# Patient Record
Sex: Female | Born: 1990 | Race: White | Hispanic: No | Marital: Married | Smoking: Former smoker
Health system: Southern US, Community
[De-identification: ages and names within clinical notes are randomized; demographics above are authoritative.]

## PROBLEM LIST (undated history)

## (undated) DIAGNOSIS — R519 Headache, unspecified: Secondary | ICD-10-CM

## (undated) DIAGNOSIS — B3731 Acute candidiasis of vulva and vagina: Secondary | ICD-10-CM

## (undated) DIAGNOSIS — B373 Candidiasis of vulva and vagina: Secondary | ICD-10-CM

## (undated) DIAGNOSIS — R87619 Unspecified abnormal cytological findings in specimens from cervix uteri: Secondary | ICD-10-CM

## (undated) DIAGNOSIS — F419 Anxiety disorder, unspecified: Secondary | ICD-10-CM

## (undated) DIAGNOSIS — L309 Dermatitis, unspecified: Secondary | ICD-10-CM

## (undated) DIAGNOSIS — B019 Varicella without complication: Secondary | ICD-10-CM

## (undated) DIAGNOSIS — K219 Gastro-esophageal reflux disease without esophagitis: Secondary | ICD-10-CM

## (undated) DIAGNOSIS — O24419 Gestational diabetes mellitus in pregnancy, unspecified control: Secondary | ICD-10-CM

## (undated) DIAGNOSIS — R51 Headache: Secondary | ICD-10-CM

## (undated) DIAGNOSIS — Z789 Other specified health status: Secondary | ICD-10-CM

## (undated) DIAGNOSIS — E669 Obesity, unspecified: Secondary | ICD-10-CM

## (undated) DIAGNOSIS — IMO0002 Reserved for concepts with insufficient information to code with codable children: Secondary | ICD-10-CM

## (undated) HISTORY — DX: Reserved for concepts with insufficient information to code with codable children: IMO0002

## (undated) HISTORY — DX: Unspecified abnormal cytological findings in specimens from cervix uteri: R87.619

## (undated) HISTORY — PX: WISDOM TOOTH EXTRACTION: SHX21

---
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
## 2011-01-12 LAB — BASIC METABOLIC PANEL
Chloride: 106
Potassium: 4.1
Sodium: 142

## 2011-01-12 LAB — URINALYSIS, ROUTINE W REFLEX MICROSCOPIC
Ketones, ur: NEGATIVE
Nitrite: NEGATIVE
Specific Gravity, Urine: 1.014
pH: 7.5

## 2011-01-12 LAB — URINE MICROSCOPIC-ADD ON

## 2011-04-20 ENCOUNTER — Encounter (HOSPITAL_COMMUNITY): Payer: Self-pay

## 2011-04-20 ENCOUNTER — Inpatient Hospital Stay (HOSPITAL_COMMUNITY)
Admission: AD | Admit: 2011-04-20 | Discharge: 2011-04-21 | Disposition: A | Discharge status: Home / Self Care | Payer: Medicaid Other | Source: Ambulatory Visit | Attending: Obstetrics and Gynecology | Admitting: Obstetrics and Gynecology

## 2011-04-20 DIAGNOSIS — B9789 Other viral agents as the cause of diseases classified elsewhere: Secondary | ICD-10-CM | POA: Insufficient documentation

## 2011-04-20 DIAGNOSIS — O99891 Other specified diseases and conditions complicating pregnancy: Secondary | ICD-10-CM | POA: Insufficient documentation

## 2011-04-20 HISTORY — DX: Acute candidiasis of vulva and vagina: B37.31

## 2011-04-20 HISTORY — DX: Dermatitis, unspecified: L30.9

## 2011-04-20 HISTORY — DX: Obesity, unspecified: E66.9

## 2011-04-20 HISTORY — DX: Varicella without complication: B01.9

## 2011-04-20 HISTORY — DX: Candidiasis of vulva and vagina: B37.3

## 2011-04-20 NOTE — Progress Notes (Signed)
Patient is here with c/o cough, sore throat and upper chest hurting  For 2 days, she describes the pain as pulling pain when she takes a deep breath. She states that she was at [facility] hospital earlier today and was told that everything was fine. Her baby and her lungs sounded good. She states that she got a voicemail from [facility] telling her to read her book about what to take for codl during pregnancy but she couldn't find the book. So she decided to come in. She denies any abdominal pain, cramping, vaginal bleeding or lof. She reports good fetal movement.

## 2011-04-20 NOTE — Progress Notes (Signed)
Pt states, " I started feeling bad on Wed night; my throat started hurting and I started hurting in my chest and couldn't sleep. It hurts like a pulling feeling in my chest when I take a deep breath and my throat is still sore and I have a dry cough. I think I had a fever last night and started sweating."

## 2011-04-21 ENCOUNTER — Encounter: Payer: Self-pay | Admitting: Obstetrics and Gynecology

## 2011-04-21 NOTE — ED Provider Notes (Signed)
History     Chief Complaint  Patient presents with  . Sore Throat  . Cough  . Chest Pain   HPI Comments: Pt is a G1P0 at [redacted]w[redacted]d that arrived with CC of sore throat, chest congestion and runny nose, watery eyes x2 days. She has not tried any OTC tx, She states she was seen at [facility] today for same complaint and wasn't given anything, she states they checked her lungs and that baby and "everything was fine" She denies any ctx, VB, LOF, +FM.  Denies any pregnancy complications. Family members also have cold sx's, denies any fever.   Sore Throat  Associated symptoms include coughing. Pertinent negatives include no abdominal pain, shortness of breath or vomiting.  Cough Associated symptoms include chest pain and a sore throat. Pertinent negatives include no fever, myalgias, shortness of breath or wheezing.  Chest Pain  Associated symptoms include a cough. Pertinent negatives include no abdominal pain, fever, nausea, shortness of breath or vomiting.      Past Medical History  Diagnosis Date  . No pertinent past medical history   . Obese   . Varicella     at age 60 or 23  . Eczema   . Yeast infection of the vagina     frequent    Past Surgical History  Procedure Date  . Wisdom tooth extraction     History reviewed. No pertinent family history.  History  Substance Use Topics  . Smoking status: Former Games developer  . Smokeless tobacco: Never Used  . Alcohol Use: No    Allergies: No Known Allergies  No prescriptions prior to admission    Review of Systems  Constitutional: Negative for fever.  HENT: Positive for sore throat.   Respiratory: Positive for cough. Negative for shortness of breath and wheezing.   Cardiovascular: Positive for chest pain.       Describes as a shortness of air or difficulty taking a big breath.   Gastrointestinal: Negative for nausea, vomiting and abdominal pain.  Musculoskeletal: Negative for myalgias and joint pain.  All other systems  reviewed and are negative.   Physical Exam   Blood pressure 121/66, pulse 97, temperature 99.2 F (37.3 C), temperature source Oral, resp. rate 18, height 5' 2.5" (1.588 m), weight 90.493 kg (199 lb 8 oz), last menstrual period 10/14/2010, SpO2 95.00%.  Physical Exam  Nursing note and vitals reviewed. Constitutional: She is oriented to person, place, and time. She appears well-developed and well-nourished. No distress.  HENT:  Mouth/Throat: Mucous membranes are normal. Posterior oropharyngeal erythema present. No oropharyngeal exudate.  Cardiovascular: Normal rate and normal heart sounds.   Respiratory: Effort normal and breath sounds normal. No respiratory distress. She has no wheezes. She has no rales. She exhibits no tenderness.  GI: Soft.  Genitourinary:       deferred  Musculoskeletal: Normal range of motion. She exhibits no edema.  Neurological: She is alert and oriented to person, place, and time.  Skin: Skin is warm and dry.  Psychiatric: She has a normal mood and affect. Her behavior is normal. Judgment and thought content normal.   FHR 130 reactive 10x10, no decels CAT 1 toco quiet Pelvic deferred MAU Course  Procedures    Assessment and Plan  IUP at 26w Likely viral illness FHR reassuring  D/C home with OTC recommedations F/U as scheduled at office or if sx's worsen.   Kahdijah Errickson M 04/21/2011, 1:57 AM

## 2011-06-25 ENCOUNTER — Encounter (INDEPENDENT_AMBULATORY_CARE_PROVIDER_SITE_OTHER): Payer: Medicaid Other | Admitting: Obstetrics and Gynecology

## 2011-06-25 DIAGNOSIS — Z331 Pregnant state, incidental: Secondary | ICD-10-CM

## 2011-06-28 ENCOUNTER — Encounter (INDEPENDENT_AMBULATORY_CARE_PROVIDER_SITE_OTHER): Payer: Medicaid Other | Admitting: Registered Nurse

## 2011-06-28 DIAGNOSIS — Z331 Pregnant state, incidental: Secondary | ICD-10-CM

## 2011-07-01 ENCOUNTER — Inpatient Hospital Stay (HOSPITAL_COMMUNITY)
Admission: AD | Admit: 2011-07-01 | Discharge: 2011-07-01 | Disposition: A | Discharge status: Home Health | Payer: Medicaid Other | Source: Ambulatory Visit | Attending: Obstetrics and Gynecology | Admitting: Obstetrics and Gynecology

## 2011-07-01 ENCOUNTER — Encounter (HOSPITAL_COMMUNITY): Payer: Self-pay | Admitting: *Deleted

## 2011-07-01 DIAGNOSIS — O4700 False labor before 37 completed weeks of gestation, unspecified trimester: Secondary | ICD-10-CM | POA: Insufficient documentation

## 2011-07-01 DIAGNOSIS — O479 False labor, unspecified: Secondary | ICD-10-CM

## 2011-07-01 NOTE — Progress Notes (Signed)
Pelvic pressure since last Monday and getting stronger throughout the week.

## 2011-07-01 NOTE — Progress Notes (Signed)
21 yo g1p0 EDC 4/1 presents with c/o of contractions, denies srom, vag bleeding, with +FM O VSS     Fhts 135s LTV mod accels     uc irregular, few     abd soft, gravid, nt     Vag 2 by RN A 36 67 week IUP GBS positive P reviewed s/s labor to report: uc, srom, vag bleeding, kick counts, f/o office 1 week. Lavera Guise, CNM

## 2011-07-01 NOTE — Progress Notes (Signed)
Pt reports having increased pelvic pressure and back and abd cramping.  Pt reports having cramping since Tues (had appointment that day). Reports having a mucusy discharge as well.

## 2011-07-01 NOTE — Discharge Instructions (Signed)
Labor Induction  Most women go into labor on their own between 37 and 42 weeks of the pregnancy. When this does not happen or when there is a medical need, medicine or other methods may be used to induce labor. Labor induction causes a pregnant woman's uterus to contract. It also causes the cervix to soften (ripen), open (dilate), and thin out (efface). Usually, labor is not induced before 39 weeks of the pregnancy unless there is a problem with the baby or mother. Whether your labor will be induced depends on a number of factors, including the following:  The medical condition of you and the baby.   How many weeks along you are.   The status of baby's lung maturity.   The condition of the cervix.   The position of the baby.  REASONS FOR LABOR INDUCTION  The health of the baby or mother is at risk.   The pregnancy is overdue by 1 week or more.   The water breaks but labor does not start on its own.   The mother has a health condition or serious illness such as high blood pressure, infection, placental abruption, or diabetes.   The amniotic fluid amounts are low around the baby.   The baby is distressed.  REASONS TO NOT INDUCE LABOR Labor induction may not be a good idea if:  It is shown that your baby does not tolerate labor.   An induction is just more convenient.   You want the baby to be born on a certain date, like a holiday.   You have had previous surgeries on your uterus, such as a myomectomy or the removal of fibroids.   Your placenta lies very low in the uterus and blocks the opening of the cervix (placenta previa).   Your baby is not in a head down position.   The umbilical cord drops down into the birth canal in front of the baby. This could cut off the baby's blood and oxygen supply.   You have had a previous cesarean delivery.   There areunusual circumstances, such as the baby being extremely premature.  RISKS AND COMPLICATIONS Problems may occur in the  process of induction and plans may need to be modified as a situation unfolds. Some of the risks of induction include:  Change in fetal heart rate, such as too high, too low, or erratic.   Risk of fetal distress.   Risk of infection to mother and baby.   Increased chance of having a cesarean delivery.   The rare, but increased chance that the placenta will separate from the uterus (abruption).   Uterine rupture (very rare).  When induction is needed for medical reasons, the benefits of induction may outweigh the risks. BEFORE THE PROCEDURE Your caregiver will check your cervix and the baby's position. This will help your caregiver decide if you are far enough along for an induction to work. PROCEDURE Several methods of labor induction may be used, such as:   Taking prostaglandin medicine to dilate and ripen the cervix. The medicine will also start contractions. It can be taken by mouth or by inserting a suppository into the vagina.   A thin tube (catheter) with a balloon on the end may be inserted into your vagina to dilate the cervix. Once inserted, the balloon expands with water, which causes the cervix to open.   Striping the membranes. Your caregiver inserts a finger between the cervix and membranes, which causes the cervix to be stretched and   may cause the uterus to contract. This is often done during an office visit. You will be sent home to wait for the contractions to begin. You will then come in for an induction.   Breaking the water. Your caregiver will make a hole in the amniotic sac using a small instrument. Once the amniotic sac breaks, contractions should begin. This may still take hours to see an effect.   Taking medicine to trigger or strengthen contractions. This medicine is given intravenously through a tube in your arm.  All of the methods of induction, besides stripping the membranes, will be done in the hospital. Induction is done in the hospital so that you and the  baby can be carefully monitored. AFTER THE PROCEDURE Some inductions can take up to 2 or 3 days. Depending on the cervix, it usually takes less time. It takes longer when you are induced early in the pregnancy or if this is your first pregnancy. If a mother is still pregnant and the induction has been going on for 2 to 3 days, either the mother will be sent home or a cesarean delivery will be needed. Document Released: 08/29/2006 Document Revised: 03/29/2011 Document Reviewed: 02/12/2011 Exit[employer] Patient Information 2012 [employer]. 

## 2011-07-06 ENCOUNTER — Encounter (INDEPENDENT_AMBULATORY_CARE_PROVIDER_SITE_OTHER): Payer: Medicaid Other | Admitting: Registered Nurse

## 2011-07-06 DIAGNOSIS — Z3480 Encounter for supervision of other normal pregnancy, unspecified trimester: Secondary | ICD-10-CM

## 2011-07-11 ENCOUNTER — Encounter (HOSPITAL_COMMUNITY): Payer: Self-pay | Admitting: *Deleted

## 2011-07-11 ENCOUNTER — Inpatient Hospital Stay (HOSPITAL_COMMUNITY)
Admission: AD | Admit: 2011-07-11 | Discharge: 2011-07-15 | DRG: 766 | Disposition: A | Discharge status: Home / Self Care | Payer: Medicaid Other | Attending: Obstetrics and Gynecology | Admitting: Obstetrics and Gynecology

## 2011-07-11 ENCOUNTER — Encounter (HOSPITAL_COMMUNITY): Payer: Self-pay | Admitting: Anesthesiology

## 2011-07-11 DIAGNOSIS — IMO0001 Reserved for inherently not codable concepts without codable children: Secondary | ICD-10-CM

## 2011-07-11 DIAGNOSIS — IMO0002 Reserved for concepts with insufficient information to code with codable children: Secondary | ICD-10-CM

## 2011-07-11 DIAGNOSIS — E669 Obesity, unspecified: Secondary | ICD-10-CM

## 2011-07-11 DIAGNOSIS — Z22330 Carrier of Group B streptococcus: Secondary | ICD-10-CM

## 2011-07-11 DIAGNOSIS — O99892 Other specified diseases and conditions complicating childbirth: Secondary | ICD-10-CM | POA: Diagnosis present

## 2011-07-11 DIAGNOSIS — Z22338 Carrier of other streptococcus: Secondary | ICD-10-CM

## 2011-07-11 DIAGNOSIS — O4290 Premature rupture of membranes, unspecified as to length of time between rupture and onset of labor, unspecified weeks of gestation: Secondary | ICD-10-CM | POA: Diagnosis present

## 2011-07-11 LAB — CBC
HCT: 36.5 % (ref 36.0–46.0)
MCH: 27.7 pg (ref 26.0–34.0)
MCHC: 32.9 g/dL (ref 30.0–36.0)
MCV: 84.3 fL (ref 78.0–100.0)
RDW: 13.8 % (ref 11.5–15.5)

## 2011-07-11 MED ORDER — EPHEDRINE 5 MG/ML INJ
10.0000 mg | INTRAVENOUS | Status: DC | PRN
  Filled 2011-07-11: qty 4

## 2011-07-11 MED ORDER — PENICILLIN G POTASSIUM 5000000 UNITS IJ SOLR
5.0000 10*6.[IU] | Freq: Once | INTRAVENOUS | Status: AC
  Administered 2011-07-11: 5 10*6.[IU] via INTRAVENOUS
  Filled 2011-07-11: qty 5

## 2011-07-11 MED ORDER — PHENYLEPHRINE 40 MCG/ML (10ML) SYRINGE FOR IV PUSH (FOR BLOOD PRESSURE SUPPORT)
80.0000 ug | PREFILLED_SYRINGE | INTRAVENOUS | Status: DC | PRN
  Filled 2011-07-11: qty 5

## 2011-07-11 MED ORDER — LACTATED RINGERS IV SOLN
500.0000 mL | INTRAVENOUS | Status: DC | PRN
  Administered 2011-07-11: 500 mL via INTRAVENOUS
  Administered 2011-07-11: 300 mL via INTRAVENOUS

## 2011-07-11 MED ORDER — FENTANYL 2.5 MCG/ML BUPIVACAINE 1/10 % EPIDURAL INFUSION (WH - ANES)
14.0000 mL/h | INTRAMUSCULAR | Status: DC
  Administered 2011-07-11 (×3): 14 mL/h via EPIDURAL
  Filled 2011-07-11 (×4): qty 60

## 2011-07-11 MED ORDER — HYDROXYZINE HCL 50 MG/ML IM SOLN: 50.0000 mg | Freq: Four times a day (QID) | INTRAMUSCULAR | Status: DC | PRN

## 2011-07-11 MED ORDER — FENTANYL CITRATE 0.05 MG/ML IJ SOLN: 100.0000 ug | INTRAMUSCULAR | Status: DC | PRN

## 2011-07-11 MED ORDER — FENTANYL 2.5 MCG/ML BUPIVACAINE 1/10 % EPIDURAL INFUSION (WH - ANES)
INTRAMUSCULAR | Status: DC | PRN
  Administered 2011-07-11: 13 mL/h via EPIDURAL

## 2011-07-11 MED ORDER — EPHEDRINE 5 MG/ML INJ: 10.0000 mg | INTRAVENOUS | Status: DC | PRN

## 2011-07-11 MED ORDER — LIDOCAINE HCL (PF) 1 % IJ SOLN
30.0000 mL | INTRAMUSCULAR | Status: DC | PRN
  Filled 2011-07-11: qty 30

## 2011-07-11 MED ORDER — DIPHENHYDRAMINE HCL 50 MG/ML IJ SOLN: 12.5000 mg | INTRAMUSCULAR | Status: DC | PRN

## 2011-07-11 MED ORDER — OXYCODONE-ACETAMINOPHEN 5-325 MG PO TABS: 1.0000 | ORAL_TABLET | ORAL | Status: DC | PRN

## 2011-07-11 MED ORDER — OXYTOCIN 20 UNITS IN LACTATED RINGERS INFUSION - SIMPLE
1.0000 m[IU]/min | INTRAVENOUS | Status: DC
  Administered 2011-07-11: 10 m[IU]/min via INTRAVENOUS
  Administered 2011-07-11: 2 m[IU]/min via INTRAVENOUS

## 2011-07-11 MED ORDER — TERBUTALINE SULFATE 1 MG/ML IJ SOLN: 0.2500 mg | Freq: Once | INTRAMUSCULAR | Status: AC | PRN

## 2011-07-11 MED ORDER — ACETAMINOPHEN 325 MG PO TABS: 650.0000 mg | ORAL_TABLET | ORAL | Status: DC | PRN

## 2011-07-11 MED ORDER — ONDANSETRON HCL 4 MG/2ML IJ SOLN: 4.0000 mg | Freq: Four times a day (QID) | INTRAMUSCULAR | Status: DC | PRN

## 2011-07-11 MED ORDER — FLEET ENEMA 7-19 GM/118ML RE ENEM: 1.0000 | ENEMA | RECTAL | Status: DC | PRN

## 2011-07-11 MED ORDER — LACTATED RINGERS IV SOLN
INTRAVENOUS | Status: DC
  Administered 2011-07-11 – 2011-07-12 (×4): via INTRAVENOUS

## 2011-07-11 MED ORDER — OXYTOCIN 20 UNITS IN LACTATED RINGERS INFUSION - SIMPLE: 125.0000 mL/h | Freq: Once | INTRAVENOUS | Status: DC

## 2011-07-11 MED ORDER — PENICILLIN G POTASSIUM 5000000 UNITS IJ SOLR
2.5000 10*6.[IU] | INTRAVENOUS | Status: DC
  Administered 2011-07-11 (×5): 2.5 10*6.[IU] via INTRAVENOUS
  Filled 2011-07-11 (×6): qty 2.5

## 2011-07-11 MED ORDER — PHENYLEPHRINE 40 MCG/ML (10ML) SYRINGE FOR IV PUSH (FOR BLOOD PRESSURE SUPPORT): 80.0000 ug | PREFILLED_SYRINGE | INTRAVENOUS | Status: DC | PRN

## 2011-07-11 MED ORDER — HYDROXYZINE HCL 50 MG PO TABS: 50.0000 mg | ORAL_TABLET | Freq: Four times a day (QID) | ORAL | Status: DC | PRN

## 2011-07-11 MED ORDER — OXYTOCIN BOLUS FROM INFUSION
500.0000 mL | Freq: Once | INTRAVENOUS | Status: DC
  Filled 2011-07-11: qty 1000
  Filled 2011-07-11: qty 500

## 2011-07-11 MED ORDER — ACETAMINOPHEN 500 MG PO TABS
1000.0000 mg | ORAL_TABLET | Freq: Four times a day (QID) | ORAL | Status: DC | PRN
  Administered 2011-07-11: 1000 mg via ORAL
  Filled 2011-07-11: qty 2

## 2011-07-11 MED ORDER — LIDOCAINE HCL (PF) 1 % IJ SOLN
INTRAMUSCULAR | Status: DC | PRN
  Administered 2011-07-11 (×2): 4 mL

## 2011-07-11 MED ORDER — OXYTOCIN 20 UNITS IN LACTATED RINGERS INFUSION - SIMPLE: 1.0000 m[IU]/min | INTRAVENOUS | Status: DC

## 2011-07-11 MED ORDER — LACTATED RINGERS IV SOLN
500.0000 mL | Freq: Once | INTRAVENOUS | Status: AC
  Administered 2011-07-11: 500 mL via INTRAVENOUS

## 2011-07-11 MED ORDER — IBUPROFEN 600 MG PO TABS: 600.0000 mg | ORAL_TABLET | Freq: Four times a day (QID) | ORAL | Status: DC | PRN

## 2011-07-11 MED ORDER — CITRIC ACID-SODIUM CITRATE 334-500 MG/5ML PO SOLN
30.0000 mL | ORAL | Status: DC | PRN
  Administered 2011-07-12: 30 mL via ORAL
  Filled 2011-07-11 (×2): qty 15

## 2011-07-11 NOTE — Anesthesia Preprocedure Evaluation (Signed)
Anesthesia Evaluation  Patient identified by MRN, date of birth, ID band Patient awake    Reviewed: Allergy & Precautions, H&P , NPO status , Patient's Chart, lab work & pertinent test results  Airway Mallampati: II TM Distance: >3 FB Neck ROM: full    Dental No notable dental hx.    Pulmonary neg pulmonary ROS,    Pulmonary exam normal       Cardiovascular negative cardio ROS      Neuro/Psych negative neurological ROS  negative psych ROS   GI/Hepatic negative GI ROS, Neg liver ROS,   Endo/Other  Morbid obesity  Renal/GU negative Renal ROS  negative genitourinary   Musculoskeletal negative musculoskeletal ROS (+)   Abdominal (+) + obese,   Peds  Hematology negative hematology ROS (+)   Anesthesia Other Findings   Reproductive/Obstetrics (+) Pregnancy                           Anesthesia Physical Anesthesia Plan  ASA: III  Anesthesia Plan: Epidural   Post-op Pain Management:    Induction:   Airway Management Planned:   Additional Equipment:   Intra-op Plan:   Post-operative Plan:   Informed Consent: I have reviewed the patients History and Physical, chart, labs and discussed the procedure including the risks, benefits and alternatives for the proposed anesthesia with the patient or authorized representative who has indicated his/her understanding and acceptance.     Plan Discussed with:   Anesthesia Plan Comments:         Anesthesia Quick Evaluation  

## 2011-07-11 NOTE — Progress Notes (Signed)
Patient ID: Madison Perry, female   DOB: 10-14-1990, 21 y.o.   MRN: [id number] .Subjective: C/o increased pressure    Objective: BP 134/67  Pulse 107  Temp(Src) 98.1 F (36.7 C) (Oral)  Resp 20  Ht 5\' 2"  (1.575 m)  Wt 99.338 kg (219 lb)  BMI 40.06 kg/m2  SpO2 98%  LMP 10/14/2010   FHT:  FHR: 120 bpm, variability: moderate,  accelerations:  Present,  decelerations:  Absent UC:   regular, every 2-3 minutes SVE:   Dilation: 9 Effacement (%): 100 Station: 0 Exam by:: Madison Perry, CNM  Pitocin at 3mu  Assessment / Plan: Induction of labor due to PROM,  progressing well on pitocin GBS pos Vertex OT, will continue pitocin for titration Frequent position changes   Fetal Wellbeing:  Category I Pain Control:  Epidural  Dr Madison Perry updated  Madison Perry 07/11/2011, 7:29 PM

## 2011-07-11 NOTE — H&P (Signed)
Madison Perry is a 21 y.o. female presenting at 38.2 weeks unannounced with CC of SROM around 2330.  Reports leakage on several occasions since initial incident and reports "green" color.  Denies any VB, PIH, or UTI s/s.  GFM.  Denies any ctxs at present or pain.  Accompanied by her GM and s.o.  Cx 2cm at her last exam at office.  She is interested in an epidural eventually.  Pt's pregnancy has been overall uncomplicated.  She started [facility] at [facility] at the end of Sept 2012 and was around 13 weeks.  She has had normal pregnancy discomforts.  Anatomy u/s was WNL.  GBS positive in urine and was treated for UTI w/ PCN at that time and will be treated in labor.   Maternal Medical History:  Reason for admission: Reason for admission: rupture of membranes.  Contractions: Frequency: irregular.   Perceived severity is mild.   Pt not discerning ctxs on arrival to hospital  Fetal activity: Perceived fetal activity is normal.   Last perceived fetal movement was within the past hour.    Prenatal complications: 1.  Teen  2.  Obese 3.  Eczema 4.  GBS positive 5.  Perry/o frequent yeast     OB History    Grav Para Term Preterm Abortions TAB SAB Ect Mult Living   1              Past Medical History  Diagnosis Date  . No pertinent past medical history   . Obese   . Varicella     at age 25 or 34  . Eczema   . Yeast infection of the vagina     frequent   Past Surgical History  Procedure Date  . Wisdom tooth extraction   . Wisdom tooth extraction    Family History: family history includes Diabetes in her paternal grandmother; Heart disease in her paternal grandmother; and Hypertension in her father and paternal grandmother. Social History:  reports that she has quit smoking. She has never used smokeless tobacco. She reports that she does not drink alcohol or use illicit drugs.Pt has a 10th grade education.  Employer: [employer].  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    Review of Systems    Constitutional: Negative.   HENT: Negative.   Respiratory: Negative.   Cardiovascular: Negative.   Gastrointestinal: Negative.   Genitourinary: Negative.   Skin: Negative.   Neurological: Negative.     Dilation: 3 Effacement (%): 70;80 Exam by:: Perry. Jessika Rothery, CNM Blood pressure 133/79, pulse 94, temperature 97.8 F (36.6 C), temperature source Oral, resp. rate 20, height 5\' 2"  (1.575 m), weight 99.338 kg (219 lb), last menstrual period 10/14/2010. Maternal Exam:  Abdomen: Fetal presentation: vertex  Introitus: Ferning test: not done.  Nitrazine test: not done. Amniotic fluid character: meconium stained.  Pelvis: adequate for delivery.   Cervix: Cervix evaluated by digital exam.     Physical Exam  Constitutional: She is oriented to person, place, and time. She appears well-developed and well-nourished. No distress.  HENT:       braces  Cardiovascular: Normal rate and regular rhythm.   Respiratory: Effort normal and breath sounds normal.  GI: Soft. Bowel sounds are normal.       gravid  Genitourinary:       Cx:  3/70/-2; posterior; mod amt of green-colored amniotic noted on pad; palpated a forebag during SVE  Musculoskeletal: She exhibits edema.       Mild generalized  BLE edema; nonpitting  Neurological: She is alert and oriented to person, place, and time. She has normal reflexes.  Skin: Skin is warm and dry.  Psychiatric: She has a normal mood and affect. Her behavior is normal. Judgment and thought content normal.    EFM:  125, reactive, moderate variability, no decels TOCO:  UC's q2-6 min, mild on palpation  Prenatal labs: ABO, Rh: A/Positive/-- (08/22 0000) Antibody:  negative Rubella:  immune RPR:   nonreactive HBsAg:   negative HIV:   nonreactive GBS:   positive 1st trimester screen:  Negative AFP:  Negative 1hr gtt=126  Assessment/Plan: 1.  IUP at 38.2 2.  SROM at 2330 3.  Meconium fluid 4.  GBS positive 5.  Latent vs early labor  1.  Admit to BS  with Dr. Stefano Gaul as attending 2.  Routine L&D orders w/ PCN-G per protocol 3.  Epidural when ctxs get more uncomfortable prn 4.  Will CTO currently for labor progression w/ expectant management; Pitocin prn augmentation 5.  Will CTO consistency of meconium, and plan NICU at delivery prn 6.  C/w MD prn  Madison Perry 07/11/2011, 2:22 AM

## 2011-07-11 NOTE — Anesthesia Procedure Notes (Signed)
Epidural Patient location during procedure: OB Start time: 07/11/2011 12:20 PM  Staffing Anesthesiologist: Asiyah Pineau A. Performed by: anesthesiologist   Preanesthetic Checklist Completed: patient identified, site marked, surgical consent, pre-op evaluation, timeout performed, IV checked, risks and benefits discussed and monitors and equipment checked  Epidural Patient position: sitting Prep: site prepped and draped and DuraPrep Patient monitoring: continuous pulse ox and blood pressure Approach: midline Injection technique: LOR air  Needle:  Needle type: Tuohy  Needle gauge: 17 G Needle length: 9 cm Needle insertion depth: 6 cm Catheter type: closed end flexible Catheter size: 19 Gauge Catheter at skin depth: 11 cm Test dose: negative and Other  Assessment Events: blood not aspirated, injection not painful, no injection resistance, negative IV test and no paresthesia  Additional Notes Patient identified. Risks and benefits discussed including failed block, incomplete  Pain control, post dural puncture headache, nerve damage, paralysis, blood pressure Changes, nausea, vomiting, reactions to medications-both toxic and allergic and post Partum back pain. All questions were answered. Patient expressed understanding and wished to proceed. Sterile technique was used throughout procedure. Epidural site was Dressed with sterile barrier dressing. No paresthesias, signs of intravascular injection Or signs of intrathecal spread were encountered.  Patient was more comfortable after the epidural was dosed. Please see RN's note for documentation of vital signs and FHR which are stable.

## 2011-07-11 NOTE — Progress Notes (Signed)
Patient ID: Madison Perry, female   DOB: 12-04-1990, 21 y.o.   MRN: [id number] .Subjective: C/o "pressure" no urge to push   Objective: BP 134/67  Pulse 107  Temp(Src) 98.1 F (36.7 C) (Oral)  Resp 20  Ht 5\' 2"  (1.575 m)  Wt 99.338 kg (219 lb)  BMI 40.06 kg/m2  SpO2 98%  LMP 10/14/2010   FHT:  FHR: 120 bpm, variability: moderate,  accelerations:  Present,  decelerations:  Present occ mild early variable UC:   regular, every 3-5 minutes SVE:   Dilation: 9 Effacement (%): 100 Station: 0 Exam by:: Kegan Mckeithan, CNM  Pitocin at 1mu Just restarted MVU's about 140's since pitocin turned off  Assessment / Plan: Induction of labor due to PROM,  progressing well on pitocin GBS pos Late decels resolved, will titrate pitocin for adequate MVU's   Fetal Wellbeing:  Category I Pain Control:  Epidural  Update physician PRN  [name] 07/11/2011, 7:23 PM

## 2011-07-11 NOTE — Progress Notes (Addendum)
Patient ID: Madison Perry, female   DOB: 1990-08-02, 21 y.o.   MRN: [id number]. Marland KitchenSubjective:  Comfortable with epidural now  Objective: BP 113/58  Pulse 75  Temp(Src) 97.8 F (36.6 C) (Axillary)  Resp 22  Ht 5\' 2"  (1.575 m)  Wt 99.338 kg (219 lb)  BMI 40.06 kg/m2  SpO2 98%  LMP 10/14/2010   FHT:  FHR: 130 bpm, variability: moderate,  accelerations:  Present,  decelerations:  Absent UC:   regular, every 2-3 minutes SVE:   Dilation: 4 Effacement (%): 80 Station: -2 Exam by:: Nahomi Hegner,CNM  Pitocin at 18mu  Assessment / Plan: labor aug with pitocin secondary to PROM GBS pos on PCN Early/active labor IUPC placed without difficulty, vertex asynclitic Will continue to augment with pitocin for adequate ctx pattern Recheck cx in 2-3 hr , prn     Fetal Wellbeing:  Category I Pain Control:  Epidural  Update physician PRN  [name] 07/11/2011, 2:35 PM

## 2011-07-11 NOTE — Progress Notes (Signed)
Patient ID: Madison Perry, female   DOB: 10-Sep-1990, 21 y.o.   MRN: [id number] .Subjective: No c/o   Objective: BP 130/54  Pulse 85  Temp(Src) 98.2 F (36.8 C) (Oral)  Resp 22  Ht 5\' 2"  (1.575 m)  Wt 99.338 kg (219 lb)  BMI 40.06 kg/m2  LMP 10/14/2010   FHT:  FHR: 130 bpm, variability: moderate,  accelerations:  Present,  decelerations:  Absent UC:   Irregular,not tracing well SVE:   Dilation: 3 Effacement (%): 80 Exam by:: Akeema Broder, CNM    Assessment / Plan: PROM without labor GBS pos, rv'd PCN Will start pitocin augmentation   Fetal Wellbeing:  Category I Pain Control:  Labor support without medications  DR Haygood updated  Cheryln Balcom M 07/11/2011, 9:22 AM

## 2011-07-11 NOTE — Progress Notes (Addendum)
Patient ID: Madison Perry, female   DOB: [birth year], 21 y.o.   MRN: [id number] .Subjective:  sleeping  Objective: BP 116/60  Pulse 76  Temp(Src) 97.5 F (36.4 C) (Oral)  Resp 20  Ht 5\' 2"  (1.575 m)  Wt 99.338 kg (219 lb)  BMI 40.06 kg/m2  SpO2 98%  LMP 10/14/2010   FHT:  FHR: 130 bpm, variability: moderate,  accelerations:  Present,  decelerations:  Present late decels UC:   regular, every 2-3 minutes SVE:   Dilation: 7 Effacement (%): 100 Station: -1 Exam by:: Oluwatobi Visser MVU's 200  Pitocin at 22mu  Assessment / Plan: Induction of labor due to PROM,  progressing well on pitocin GBS pos Will Decrease pitocin to 10mu, and CTO FHR closely   Fetal Wellbeing:  Category II Pain Control:  Epidural  Update physician PRN  [name] 07/11/2011, 4:42 PM

## 2011-07-11 NOTE — MAU Note (Signed)
H. Steelman, CNM at bedside.  Assessment done and poc discussed with pt.  

## 2011-07-11 NOTE — Progress Notes (Signed)
Pt may go to room 160.

## 2011-07-11 NOTE — Progress Notes (Signed)
Patient ID: Madison Perry, female   DOB: 02-14-1991, 21 y.o.   MRN: [id number] .Subjective: Breathing w ctx, c/o increased pressure, c/o HA "feels hungry"   Objective: BP 127/65  Pulse 86  Temp(Src) 98.1 F (36.7 C) (Oral)  Resp 20  Ht 5\' 2"  (1.575 m)  Wt 99.338 kg (219 lb)  BMI 40.06 kg/m2  SpO2 98%  LMP 10/14/2010   FHT:  FHR: 120 bpm, variability: moderate,  accelerations:  Present,  decelerations:  Absent UC:   regular, every 2-3 minutes SVE:   Dilation: 9 Effacement (%): 100 Station: 0 Exam by:: Madison Perry, CNM  Pitocin at 4mu MVU's last 2 hours 140-170  Assessment / Plan: IOL secondary to PROM No cervical change since 1900 MVU's not adequate, will continue to titrate pitocin  GBS pos Will give tylenol for HA Will use epidural PCA, will consult anesthesia/replace epidural if level not adequate    Fetal Wellbeing:  Category I Pain Control:  Epidural  Update physician PRN  [name] 07/11/2011, 8:45 PM

## 2011-07-11 NOTE — Progress Notes (Signed)
Eustace Pen, CNM notified of pt presenting with ROM.  notified of meconium stained fluid seen.  Will be down to see pt.

## 2011-07-12 ENCOUNTER — Encounter (HOSPITAL_COMMUNITY)
Admission: AD | Disposition: A | Discharge status: Home / Self Care | Payer: Self-pay | Source: Home / Self Care | Attending: Obstetrics and Gynecology

## 2011-07-12 ENCOUNTER — Encounter (HOSPITAL_COMMUNITY): Payer: Self-pay | Admitting: Anesthesiology

## 2011-07-12 ENCOUNTER — Inpatient Hospital Stay (HOSPITAL_COMMUNITY): Payer: Medicaid Other | Admitting: Anesthesiology

## 2011-07-12 ENCOUNTER — Encounter (HOSPITAL_COMMUNITY): Payer: Self-pay

## 2011-07-12 LAB — CBC
Platelets: 241 10*3/uL (ref 150–400)
RDW: 13.6 % (ref 11.5–15.5)
WBC: 19.1 10*3/uL — ABNORMAL HIGH (ref 4.0–10.5)

## 2011-07-12 SURGERY — Surgical Case
Anesthesia: Epidural | Site: Abdomen | Wound class: Clean Contaminated

## 2011-07-12 MED ORDER — OXYTOCIN 20 UNITS IN LACTATED RINGERS INFUSION - SIMPLE: 125.0000 mL/h | INTRAVENOUS | Status: AC

## 2011-07-12 MED ORDER — IBUPROFEN 600 MG PO TABS
600.0000 mg | ORAL_TABLET | Freq: Four times a day (QID) | ORAL | Status: DC
  Administered 2011-07-12 – 2011-07-15 (×13): 600 mg via ORAL
  Filled 2011-07-12 (×13): qty 1

## 2011-07-12 MED ORDER — WITCH HAZEL-GLYCERIN EX PADS: 1.0000 "application " | MEDICATED_PAD | CUTANEOUS | Status: DC | PRN

## 2011-07-12 MED ORDER — LANOLIN HYDROUS EX OINT: 1.0000 "application " | TOPICAL_OINTMENT | CUTANEOUS | Status: DC | PRN

## 2011-07-12 MED ORDER — ONDANSETRON HCL 4 MG/2ML IJ SOLN
INTRAMUSCULAR | Status: AC
  Filled 2011-07-12: qty 2

## 2011-07-12 MED ORDER — SODIUM CHLORIDE 0.9 % IV SOLN
1.0000 ug/kg/h | INTRAVENOUS | Status: DC | PRN
  Filled 2011-07-12: qty 2.5

## 2011-07-12 MED ORDER — 0.9 % SODIUM CHLORIDE (POUR BTL) OPTIME
TOPICAL | Status: DC | PRN
  Administered 2011-07-12: 1000 mL

## 2011-07-12 MED ORDER — KETOROLAC TROMETHAMINE 60 MG/2ML IM SOLN
60.0000 mg | Freq: Once | INTRAMUSCULAR | Status: AC | PRN
  Administered 2011-07-12: 60 mg via INTRAMUSCULAR

## 2011-07-12 MED ORDER — DIBUCAINE 1 % RE OINT: 1.0000 "application " | TOPICAL_OINTMENT | RECTAL | Status: DC | PRN

## 2011-07-12 MED ORDER — ONDANSETRON HCL 4 MG/2ML IJ SOLN
INTRAMUSCULAR | Status: DC | PRN
  Administered 2011-07-12: 4 mg via INTRAVENOUS

## 2011-07-12 MED ORDER — LIDOCAINE-EPINEPHRINE (PF) 2 %-1:200000 IJ SOLN
INTRAMUSCULAR | Status: DC | PRN
  Administered 2011-07-12: 5 mL via EPIDURAL

## 2011-07-12 MED ORDER — FENTANYL CITRATE 0.05 MG/ML IJ SOLN
INTRAMUSCULAR | Status: AC
  Filled 2011-07-12: qty 2

## 2011-07-12 MED ORDER — OXYTOCIN 10 UNIT/ML IJ SOLN
INTRAMUSCULAR | Status: AC
  Filled 2011-07-12: qty 4

## 2011-07-12 MED ORDER — OXYTOCIN 20 UNITS IN LACTATED RINGERS INFUSION - SIMPLE
INTRAVENOUS | Status: DC | PRN
  Administered 2011-07-12: 20 [IU] via INTRAVENOUS

## 2011-07-12 MED ORDER — ONDANSETRON HCL 4 MG PO TABS: 4.0000 mg | ORAL_TABLET | ORAL | Status: DC | PRN

## 2011-07-12 MED ORDER — SENNOSIDES-DOCUSATE SODIUM 8.6-50 MG PO TABS
2.0000 | ORAL_TABLET | Freq: Every day | ORAL | Status: DC
  Administered 2011-07-12 – 2011-07-14 (×3): 2 via ORAL

## 2011-07-12 MED ORDER — IBUPROFEN 600 MG PO TABS: 600.0000 mg | ORAL_TABLET | Freq: Four times a day (QID) | ORAL | Status: DC | PRN

## 2011-07-12 MED ORDER — SODIUM BICARBONATE 8.4 % IV SOLN
INTRAVENOUS | Status: AC
  Filled 2011-07-12: qty 50

## 2011-07-12 MED ORDER — SCOPOLAMINE 1 MG/3DAYS TD PT72
MEDICATED_PATCH | TRANSDERMAL | Status: AC
  Filled 2011-07-12: qty 1

## 2011-07-12 MED ORDER — CEFAZOLIN SODIUM 1-5 GM-% IV SOLN
INTRAVENOUS | Status: DC | PRN
  Administered 2011-07-12: 2 g via INTRAVENOUS

## 2011-07-12 MED ORDER — ONDANSETRON HCL 4 MG/2ML IJ SOLN: 4.0000 mg | Freq: Three times a day (TID) | INTRAMUSCULAR | Status: DC | PRN

## 2011-07-12 MED ORDER — SODIUM CHLORIDE 0.9 % IJ SOLN: 3.0000 mL | INTRAMUSCULAR | Status: DC | PRN

## 2011-07-12 MED ORDER — KETOROLAC TROMETHAMINE 30 MG/ML IJ SOLN: 30.0000 mg | Freq: Four times a day (QID) | INTRAMUSCULAR | Status: AC | PRN

## 2011-07-12 MED ORDER — SCOPOLAMINE 1 MG/3DAYS TD PT72
1.0000 | MEDICATED_PATCH | Freq: Once | TRANSDERMAL | Status: AC
  Administered 2011-07-12: 1.5 mg via TRANSDERMAL

## 2011-07-12 MED ORDER — OXYCODONE-ACETAMINOPHEN 5-325 MG PO TABS
1.0000 | ORAL_TABLET | ORAL | Status: DC | PRN
  Administered 2011-07-13 – 2011-07-14 (×3): 1 via ORAL
  Filled 2011-07-12 (×3): qty 1

## 2011-07-12 MED ORDER — MENTHOL 3 MG MT LOZG: 1.0000 | LOZENGE | OROMUCOSAL | Status: DC | PRN

## 2011-07-12 MED ORDER — SIMETHICONE 80 MG PO CHEW
80.0000 mg | CHEWABLE_TABLET | Freq: Three times a day (TID) | ORAL | Status: DC
  Administered 2011-07-12 – 2011-07-15 (×12): 80 mg via ORAL

## 2011-07-12 MED ORDER — LACTATED RINGERS IV SOLN
INTRAVENOUS | Status: DC | PRN
  Administered 2011-07-12: 01:00:00 via INTRAVENOUS

## 2011-07-12 MED ORDER — NALBUPHINE SYRINGE 5 MG/0.5 ML
5.0000 mg | INJECTION | INTRAMUSCULAR | Status: DC | PRN
  Filled 2011-07-12: qty 1

## 2011-07-12 MED ORDER — TETANUS-DIPHTH-ACELL PERTUSSIS 5-2.5-18.5 LF-MCG/0.5 IM SUSP: 0.5000 mL | Freq: Once | INTRAMUSCULAR | Status: DC

## 2011-07-12 MED ORDER — DIPHENHYDRAMINE HCL 50 MG/ML IJ SOLN: 12.5000 mg | INTRAMUSCULAR | Status: DC | PRN

## 2011-07-12 MED ORDER — MORPHINE SULFATE 0.5 MG/ML IJ SOLN
INTRAMUSCULAR | Status: AC
  Filled 2011-07-12: qty 10

## 2011-07-12 MED ORDER — CEFAZOLIN SODIUM 1-5 GM-% IV SOLN
INTRAVENOUS | Status: AC
  Filled 2011-07-12: qty 100

## 2011-07-12 MED ORDER — DIPHENHYDRAMINE HCL 25 MG PO CAPS: 25.0000 mg | ORAL_CAPSULE | Freq: Four times a day (QID) | ORAL | Status: DC | PRN

## 2011-07-12 MED ORDER — FENTANYL CITRATE 0.05 MG/ML IJ SOLN
INTRAMUSCULAR | Status: DC | PRN
  Administered 2011-07-12: 100 ug via INTRAVENOUS
  Administered 2011-07-12 (×2): 50 ug via INTRAVENOUS

## 2011-07-12 MED ORDER — MEDROXYPROGESTERONE ACETATE 150 MG/ML IM SUSP: 150.0000 mg | INTRAMUSCULAR | Status: DC | PRN

## 2011-07-12 MED ORDER — ONDANSETRON HCL 4 MG/2ML IJ SOLN: 4.0000 mg | INTRAMUSCULAR | Status: DC | PRN

## 2011-07-12 MED ORDER — BUPIVACAINE HCL (PF) 0.25 % IJ SOLN
INTRAMUSCULAR | Status: AC
  Filled 2011-07-12: qty 30

## 2011-07-12 MED ORDER — DIPHENHYDRAMINE HCL 25 MG PO CAPS: 25.0000 mg | ORAL_CAPSULE | ORAL | Status: DC | PRN

## 2011-07-12 MED ORDER — LACTATED RINGERS IV SOLN
INTRAVENOUS | Status: DC
  Administered 2011-07-12: 06:00:00 via INTRAVENOUS

## 2011-07-12 MED ORDER — METOCLOPRAMIDE HCL 5 MG/ML IJ SOLN: 10.0000 mg | Freq: Three times a day (TID) | INTRAMUSCULAR | Status: DC | PRN

## 2011-07-12 MED ORDER — MORPHINE SULFATE (PF) 0.5 MG/ML IJ SOLN
INTRAMUSCULAR | Status: DC | PRN
  Administered 2011-07-12: 4 mg via EPIDURAL

## 2011-07-12 MED ORDER — DIPHENHYDRAMINE HCL 50 MG/ML IJ SOLN: 25.0000 mg | INTRAMUSCULAR | Status: DC | PRN

## 2011-07-12 MED ORDER — LIDOCAINE-EPINEPHRINE (PF) 2 %-1:200000 IJ SOLN
INTRAMUSCULAR | Status: AC
  Filled 2011-07-12: qty 20

## 2011-07-12 MED ORDER — FENTANYL CITRATE 0.05 MG/ML IJ SOLN: 25.0000 ug | INTRAMUSCULAR | Status: DC | PRN

## 2011-07-12 MED ORDER — MIDAZOLAM HCL 5 MG/5ML IJ SOLN
INTRAMUSCULAR | Status: DC | PRN
  Administered 2011-07-12: 1 mg via INTRAVENOUS

## 2011-07-12 MED ORDER — MORPHINE SULFATE (PF) 0.5 MG/ML IJ SOLN
INTRAMUSCULAR | Status: DC | PRN
  Administered 2011-07-12: 1 mg via INTRAVENOUS

## 2011-07-12 MED ORDER — ZOLPIDEM TARTRATE 5 MG PO TABS: 5.0000 mg | ORAL_TABLET | Freq: Every evening | ORAL | Status: DC | PRN

## 2011-07-12 MED ORDER — NALOXONE HCL 0.4 MG/ML IJ SOLN: 0.4000 mg | INTRAMUSCULAR | Status: DC | PRN

## 2011-07-12 MED ORDER — MIDAZOLAM HCL 2 MG/2ML IJ SOLN
INTRAMUSCULAR | Status: AC
  Filled 2011-07-12: qty 2

## 2011-07-12 MED ORDER — MEPERIDINE HCL 25 MG/ML IJ SOLN: 6.2500 mg | INTRAMUSCULAR | Status: DC | PRN

## 2011-07-12 MED ORDER — BUPIVACAINE HCL (PF) 0.25 % IJ SOLN
INTRAMUSCULAR | Status: DC | PRN
  Administered 2011-07-12: 20 mL

## 2011-07-12 MED ORDER — SIMETHICONE 80 MG PO CHEW: 80.0000 mg | CHEWABLE_TABLET | ORAL | Status: DC | PRN

## 2011-07-12 MED ORDER — PRENATAL MULTIVITAMIN CH
1.0000 | ORAL_TABLET | Freq: Every day | ORAL | Status: DC
  Administered 2011-07-12 – 2011-07-15 (×4): 1 via ORAL
  Filled 2011-07-12 (×4): qty 1

## 2011-07-12 MED ORDER — KETOROLAC TROMETHAMINE 60 MG/2ML IM SOLN
INTRAMUSCULAR | Status: AC
  Filled 2011-07-12: qty 2

## 2011-07-12 SURGICAL SUPPLY — 51 items
ADH SKN CLS APL DERMABOND .7 (GAUZE/BANDAGES/DRESSINGS)
APL SKNCLS STERI-STRIP NONHPOA (GAUZE/BANDAGES/DRESSINGS)
BENZOIN TINCTURE PRP APPL 2/3 (GAUZE/BANDAGES/DRESSINGS) IMPLANT
BLADE EXTENDED COATED 6.5IN (ELECTRODE) IMPLANT
BLADE HEX COATED 2.75 (ELECTRODE) IMPLANT
BOOTIES KNEE HIGH SLOAN (MISCELLANEOUS) ×4 IMPLANT
CHLORAPREP W/TINT 26ML (MISCELLANEOUS) ×2 IMPLANT
CLOTH BEACON ORANGE TIMEOUT ST (SAFETY) ×2 IMPLANT
CONTAINER PREFILL 10% NBF 15ML (MISCELLANEOUS) ×2 IMPLANT
DERMABOND ADVANCED (GAUZE/BANDAGES/DRESSINGS)
DERMABOND ADVANCED .7 DNX12 (GAUZE/BANDAGES/DRESSINGS) IMPLANT
DRAIN JACKSON PRT FLT 7MM (DRAIN) IMPLANT
DRESSING TELFA 8X3 (GAUZE/BANDAGES/DRESSINGS) IMPLANT
ELECT REM PT RETURN 9FT ADLT (ELECTROSURGICAL) ×2
ELECTRODE REM PT RTRN 9FT ADLT (ELECTROSURGICAL) ×1 IMPLANT
EVACUATOR SILICONE 100CC (DRAIN) IMPLANT
EXTRACTOR VACUUM KIWI (MISCELLANEOUS) IMPLANT
EXTRACTOR VACUUM M CUP 4 TUBE (SUCTIONS) IMPLANT
GAUZE SPONGE 4X4 12PLY STRL LF (GAUZE/BANDAGES/DRESSINGS) ×3 IMPLANT
GLOVE BIO SURGEON STRL SZ7 (GLOVE) ×1 IMPLANT
GLOVE SKINSENSE NS SZ7.5 (GLOVE) ×2
GLOVE SKINSENSE NS SZ8.0 LF (GLOVE) ×2
GLOVE SKINSENSE STRL SZ7.5 (GLOVE) IMPLANT
GLOVE SKINSENSE STRL SZ8.0 LF (GLOVE) IMPLANT
GLOVE SURG SS PI 6.5 STRL IVOR (GLOVE) ×4 IMPLANT
GOWN PREVENTION PLUS LG XLONG (DISPOSABLE) ×6 IMPLANT
KIT ABG SYR 3ML LUER SLIP (SYRINGE) IMPLANT
NDL HYPO 25X5/8 SAFETYGLIDE (NEEDLE) ×1 IMPLANT
NDL SPNL 22GX3.5 QUINCKE BK (NEEDLE) ×1 IMPLANT
NEEDLE HYPO 25X5/8 SAFETYGLIDE (NEEDLE) ×2 IMPLANT
NEEDLE SPNL 22GX3.5 QUINCKE BK (NEEDLE) ×2 IMPLANT
NS IRRIG 1000ML POUR BTL (IV SOLUTION) ×2 IMPLANT
PACK C SECTION WH (CUSTOM PROCEDURE TRAY) ×2 IMPLANT
PAD ABD 7.5X8 STRL (GAUZE/BANDAGES/DRESSINGS) ×1 IMPLANT
SLEEVE SCD COMPRESS KNEE MED (MISCELLANEOUS) ×1 IMPLANT
STRIP CLOSURE SKIN 1/4X4 (GAUZE/BANDAGES/DRESSINGS) IMPLANT
SUT CHROMIC 2 0 SH (SUTURE) ×2 IMPLANT
SUT MNCRL AB 3-0 PS2 27 (SUTURE) ×2 IMPLANT
SUT SILK 0 FSL (SUTURE) IMPLANT
SUT VIC AB 0 CT1 27 (SUTURE) ×4
SUT VIC AB 0 CT1 27XBRD ANBCTR (SUTURE) ×2 IMPLANT
SUT VIC AB 0 CT1 36 (SUTURE) ×2 IMPLANT
SUT VIC AB 0 CTXB 36 (SUTURE) IMPLANT
SUT VIC AB 2-0 CT1 27 (SUTURE) ×4
SUT VIC AB 2-0 CT1 TAPERPNT 27 (SUTURE) ×2 IMPLANT
SUT VIC AB 2-0 SH 27 (SUTURE)
SUT VIC AB 2-0 SH 27XBRD (SUTURE) IMPLANT
SYR CONTROL 10ML LL (SYRINGE) ×2 IMPLANT
TOWEL OR 17X24 6PK STRL BLUE (TOWEL DISPOSABLE) ×4 IMPLANT
TRAY FOLEY CATH 14FR (SET/KITS/TRAYS/PACK) ×1 IMPLANT
WATER STERILE IRR 1000ML POUR (IV SOLUTION) ×1 IMPLANT

## 2011-07-12 NOTE — Addendum Note (Signed)
Addendum  created 07/12/11 0827 by Shanon Payor, CRNA   Modules edited:Notes Section

## 2011-07-12 NOTE — Progress Notes (Signed)
Patient ID: Madison Perry, female   DOB: 27-Dec-1990, 21 y.o.   MRN: [id number] .Subjective: Remains comfortable with epidural, HA resolved   Objective: BP 118/74  Pulse 91  Temp(Src) 98.1 F (36.7 C) (Oral)  Resp 20  Ht 5\' 2"  (1.575 m)  Wt 99.338 kg (219 lb)  BMI 40.06 kg/m2  SpO2 98%  LMP 10/14/2010   FHT:  FHR: 130 bpm, variability: moderate,  accelerations:  Present,  decelerations:  Absent UC:   regular, every 2-3 minutes SVE:   Dilation: 9 Effacement (%): 100 Station: 0 Exam by:: Janson Lamar, CNM  Pitocin at 7mu  Assessment / Plan: no cervical change, after >2hour MUV >180 PROM GBS pos - received PCN FHR reassuring   Fetal Wellbeing:  Category I Pain Control:  Epidural  R/B reviewed w pt, risks including but not limited to: 1. Infection 2. Bleeding 3. Damage to internal organs 4. Anesthesia complications  D/W Dr Pennie Rushing Will proceed with primary C/S   Dagny Fiorentino M 07/12/2011, 12:04 AM

## 2011-07-12 NOTE — Op Note (Signed)
Cesarean Section Procedure Note  Indications: failure to progress: arrest of dilation  Pre-operative Diagnosis: 38 week 3 day pregnancy.  Post-operative Diagnosis: same  Surgeon: Hal Morales  First Assistant:Shelley Lillard  Surgeon: Hal Morales   Assistants:   Anesthesia: Epidural anesthesia  ASA Class: 2  Procedure Details  The patient was seen in the Holding Room. The risks, benefits, complications, treatment options, and expected outcomes were discussed with the patient.  The patient concurred with the proposed plan, giving informed consent.  The site of surgery properly noted/marked. The patient was taken to Operating Room # 1, identified as Madison Perry and the procedure verified as C-Section Delivery. A Time Out was held and the above information confirmed.  After induction of anesthesia, the patient was  prepped with Chloraprep in the usual sterile manner.A foley catheter was placed under sterile conditions.  The patient was then draped in the usual fashion.   Suprapubicsubcutaneous injection of 0.25% Bupivacaine   A Pfannenstiel incision was made and carried down through the subcutaneous tissue to the fascia. Fascial incision was made and extended transversely. The fascia was separated from the underlying rectus tissue superiorly and inferiorly. The peritoneum was identified and entered. Peritoneal incision was extended longitudinally.  The bladder blade was placed.   A low transverse uterine incision was made two cm above the uterovesical fold, and that incision extended transversely bluntly.The infant was  delivered from OT presentation was a 8 pound 3 ounce female with Apgar scores of 9 at one minute and 9 at five minutes. After the umbilical cord was clamped and cut cord blood was obtained for evaluation. The placenta was removed intact and appeared normal. The uterine outline, tubes and ovaries appeared norma for the gravid state. The uterine incision was closed  with running locked sutures of 0 Vicryl. An imbricating layer of sutures was placed. Hemostasis was observed. Lavage was carried out until clear. The peritoneum was closed with a running suture of 2-0 Vicryl.  The rectus muscles were reapproximated with a figure of 8 suture of 2-0 Vicryl.  The fascia was then reapproximated with a running sutures of 0 Vicryl .Renforcing figure of 8 sutures of 0Vicryl were placed on either side of midline.   The skin was reapproximated with 3-0 moncryl.  A sterile dressing was applied.  Instrument, sponge, and needle counts were correct prior to the abdominal closure and at the conclusion of the case.   Findings:  Placenta contained a 3vessel cord  Estimated Blood Loss:  750         Drains: none         Total IV Fluids:         Specimens: placenta         Implants: none         Complications: ::"None; patient tolerated the procedure well."         Disposition: PACU - hemodynamically stable. infant to the full-term nursery         Condition: stable  Attending Attestation: I performed the procedure.  HAYGOOD,VANESSA P  07/12/2011 1:39 AM

## 2011-07-12 NOTE — Progress Notes (Signed)
Patient Oxygen level decrease low as 89% when patient is sleeping. Rise back up to 95% when waking patient up to do some deep breathing. Notified MD on call and reported to AM Nurse.

## 2011-07-12 NOTE — Anesthesia Postprocedure Evaluation (Signed)
  Anesthesia Post-op Note  Patient: Madison Perry  Procedure(s) Performed: Procedure(s) (LRB): CESAREAN SECTION (N/A)  Patient Location: Mother/Baby  Anesthesia Type: Epidural  Level of Consciousness: awake, alert  and oriented  Airway and Oxygen Therapy: Patient Spontanous Breathing  Post-op Pain: none  Post-op Assessment: Post-op Vital signs reviewed, Patient's Cardiovascular Status Stable, No headache, No backache, No residual numbness and No residual motor weakness  Post-op Vital Signs: Reviewed and stable  Complications: No apparent anesthesia complications

## 2011-07-12 NOTE — Progress Notes (Signed)
UR Chart review completed.  

## 2011-07-12 NOTE — Progress Notes (Signed)
Madison Perry is a 21 y.o. G1P0000 at [redacted]w[redacted]d by LMP admitted for rupture of membranes  Subjective: comfortable with epidural   Objective: BP 136/58  Pulse 119  Temp(Src) 98.2 F (36.8 C) (Oral)  Resp 20  Ht 5\' 2"  (1.575 m)  Wt 219 lb (99.338 kg)  BMI 40.06 kg/m2  SpO2 98%  LMP 10/14/2010   Total I/O In: -  Out: 650 [Urine:650]  FHT:  FHR: 130s bpm, variability: moderate,  accelerations:  Present,  decelerations:  Absent UC:   regular, every 2-3 minutes SVE:   Dilation: 9 Effacement (%): 100 Station: 0 Exam by:: Lillard, CNM  Labs: Lab Results  Component Value Date   WBC 12.8* 07/11/2011   HGB 12.0 07/11/2011   HCT 36.5 07/11/2011   MCV 84.3 07/11/2011   PLT 283 07/11/2011    Assessment / Plan: Arrest in active phase of labor  Labor: Failure to progress with adequate uterine contractions Preeclampsia:  no signs or symptoms of toxicity Fetal Wellbeing:  Category II Pain Control:  Epidural I/D:  positive GBS with last penicillin dose at 11pm Anticipated MOD:  CSection recommended and accepted. Indications, risks and benefits reviewed and patient consents.  Madison Perry 07/12/2011, 12:23 AM

## 2011-07-12 NOTE — Progress Notes (Signed)
S Resting quietly in bed, c/o hungry, comfortable, O2 makes my nose dry O Easy respirations, lungs clear bilaterally on auscultation, AP RRR, -Homan's sign         bilaterally,     O2 Sat to 87 at times with sleep A low O2 sats with sleep P discussed importance of maintaining normal O2 sats, decrease with sleep, narcotics, agrees to humidified O2 nasal cannula. Collaboration with Dr. Normand Sloop per telephone. Lavera Guise, CNM

## 2011-07-12 NOTE — Progress Notes (Addendum)
Haygood, MD, at bedside. Patient informed of the risks and benefits of a cesearean section. Patient verbalizes understanding. Consents obtained

## 2011-07-12 NOTE — Anesthesia Postprocedure Evaluation (Signed)
Anesthesia Post Note  Patient: Madison Perry  Procedure(s) Performed: Procedure(s) (LRB): CESAREAN SECTION (N/A)  Anesthesia type: Epidural  Patient location: PACU  Post pain: Pain level controlled  Post assessment: Post-op Vital signs reviewed  Last Vitals:  Filed Vitals:   07/12/11 0215  BP: 135/80  Pulse: 103  Temp:   Resp: 22    Post vital signs: stable  Level of consciousness: awake  Complications: No apparent anesthesia complications

## 2011-07-12 NOTE — Transfer of Care (Signed)
Immediate Anesthesia Transfer of Care Note  Patient: Madison Perry  Procedure(s) Performed: Procedure(s) (LRB): CESAREAN SECTION (N/A)  Patient Location: PACU  Anesthesia Type: Epidural  Level of Consciousness: awake, alert , oriented and patient cooperative  Airway & Oxygen Therapy: Patient Spontanous Breathing  Post-op Assessment: Report given to PACU RN and Post -op Vital signs reviewed and stable  Post vital signs: Reviewed and stable  Complications: No apparent anesthesia complications

## 2011-07-13 NOTE — Progress Notes (Signed)
Subjective: Postpartum Day 1: Cesarean Delivery Patient reports no problems voiding.  Doing well, up ad lib without any syncope or dizziness.  Undecided regarding contraception.    Objective: Vital signs in last 24 hours: Temp:  [97.5 F (36.4 C)-98.3 F (36.8 C)] 97.5 F (36.4 C) (03/22 1430) Pulse Rate:  [67-86] 67  (03/22 1430) Resp:  [16-18] 18  (03/22 1430) BP: (99-112)/(60-78) 100/66 mmHg (03/22 1430) SpO2:  [96 %-97 %] 97 % (03/22 0020)  Results for orders placed during the hospital encounter of 07/11/11 (from the past 48 hour(s))  CBC     Status: Abnormal   Collection Time   07/12/11  5:00 AM      Component Value Range Comment   WBC 19.1 (*) 4.0 - 10.5 (K/uL)    RBC 3.86 (*) 3.87 - 5.11 (MIL/uL)    Hemoglobin 10.7 (*) 12.0 - 15.0 (g/dL)    HCT 16.1 (*) 09.6 - 46.0 (%)    MCV 84.5  78.0 - 100.0 (fL)    MCH 27.7  26.0 - 34.0 (pg)    MCHC 32.8  30.0 - 36.0 (g/dL)    RDW 04.5  40.9 - 81.1 (%)    Platelets 241  150 - 400 (K/uL)    Labs done yesterday am.  Physical Exam:  General: alert Lochia: appropriate Uterine Fundus: firm Incision: healing well DVT Evaluation: No evidence of DVT seen on physical exam. Negative Homan's sign.   Basename 07/12/11 0500 07/11/11 0225  HGB 10.7* 12.0  HCT 32.6* 36.5    Assessment/Plan: Status post Cesarean section. Doing well postoperatively.  Continue current care. Anticipate d/c on Sunday.  Nigel Bridgeman 07/13/2011, 3:38 PM    S

## 2011-07-14 NOTE — Progress Notes (Signed)
Post Partum Day 2 Subjective: States she is doing well.  Reports ambulating, voiding and tol po liquids and solids without difficulty.  Pos flatus, pos BM.  Pain well controlled with meds.  Reports decreased lochia.  Pumping breastmilk without difficulty.  Reports no headache, vision chgs, RUQ pain and edema in feet/legs has decreased.    Objective: Blood pressure 114/68, pulse 92, temperature 97.8 F (36.6 C), temperature source Oral, resp. rate 20, height 5\' 2"  (1.575 m), weight 99.338 kg (219 lb), last menstrual period 10/14/2010, SpO2 97.00%, unknown if currently breastfeeding. Filed Vitals:   07/13/11 1430 07/13/11 2254 07/14/11 0525 07/14/11 1355  BP: 100/66 136/76 113/68 114/68  Pulse: 67 98 90 92  Temp: 97.5 F (36.4 C) 97.8 F (36.6 C) 98 F (36.7 C) 97.8 F (36.6 C)  TempSrc: Oral Oral Oral Oral  Resp: 18 20 20 20   Height:      Weight:      SpO2:        Physical Exam:  General: alert, cooperative and no distress Heart:  RRR Lungs:  CTA bilat Abd:  Soft, non-tender with pos BS x 4 quads.   Lochia: appropriate Uterine Fundus: firm, NT 1 FB below umbilicus Incision: healing well DVT Evaluation: No evidence of DVT seen on physical exam. Negative Homan's sign. Calf/Ankle edema is present. 2+ edema noted. DTRs 1+, no clonus   Basename 07/12/11 0500  HGB 10.7*  HCT 32.6*    Assessment/Plan: Stable s/p C/S  Anticipate discharge tomorrow.   Continue current plan of care.   LOS: 3 days   Lizmary Nader O. 07/14/2011, 10:06 PM

## 2011-07-15 MED ORDER — IBUPROFEN 600 MG PO TABS: 600.0000 mg | ORAL_TABLET | Freq: Four times a day (QID) | ORAL | Status: AC | PRN

## 2011-07-15 MED ORDER — OXYCODONE-ACETAMINOPHEN 5-325 MG PO TABS: 1.0000 | ORAL_TABLET | ORAL | Status: AC | PRN

## 2011-07-15 NOTE — Discharge Instructions (Signed)
Breastfeeding BENEFITS OF BREASTFEEDING For the baby  The first milk (colostrum) helps the baby's digestive system function better.   There are antibodies from the mother in the milk that help the baby fight off infections.   The baby has a lower incidence of asthma, allergies, and SIDS (sudden infant death syndrome).   The nutrients in breast milk are better than formulas for the baby and helps the baby's brain grow better.   Babies who breastfeed have less gas, colic, and constipation.  For the mother  Breastfeeding helps develop a very special bond between mother and baby.   It is more convenient, always available at the correct temperature and cheaper than formula feeding.   It burns calories in the mother and helps with losing weight that was gained during pregnancy.   It makes the uterus contract back down to normal size faster and slows bleeding following delivery.   Breastfeeding mothers have a lower risk of developing breast cancer.  NURSE FREQUENTLY  A healthy, full-term baby may breastfeed as often as every hour or space his or her feedings to every 3 hours.   How often to nurse will vary from baby to baby. Watch your baby for signs of hunger, not the clock.   Nurse as often as the baby requests, or when you feel the need to reduce the fullness of your breasts.   Awaken the baby if it has been 3 to 4 hours since the last feeding.   Frequent feeding will help the mother make more milk and will prevent problems like sore nipples and engorgement of the breasts.  BABY'S POSITION AT THE BREAST  Whether lying down or sitting, be sure that the baby's tummy is facing your tummy.   Support the breast with 4 fingers underneath the breast and the thumb above. Make sure your fingers are well away from the nipple and baby's mouth.   Stroke the baby's lips and cheek closest to the breast gently with your finger or nipple.   When the baby's mouth is open wide enough, place all  of your nipple and as much of the dark area around the nipple as possible into your baby's mouth.   Pull the baby in close so the tip of the nose and the baby's cheeks touch the breast during the feeding.  FEEDINGS  The length of each feeding varies from baby to baby and from feeding to feeding.   The baby must suck about 2 to 3 minutes for your milk to get to him or her. This is called a "let down." For this reason, allow the baby to feed on each breast as long as he or she wants. Your baby will end the feeding when he or she has received the right balance of nutrients.   To break the suction, put your finger into the corner of the baby's mouth and slide it between his or her gums before removing your breast from his or her mouth. This will help prevent sore nipples.  REDUCING BREAST ENGORGEMENT  In the first week after your baby is born, you may experience signs of breast engorgement. When breasts are engorged, they feel heavy, warm, full, and may be tender to the touch. You can reduce engorgement if you:   Nurse frequently, every 2 to 3 hours. Mothers who breastfeed early and often have fewer problems with engorgement.   Place light ice packs on your breasts between feedings. This reduces swelling. Wrap the ice packs in a   lightweight towel to protect your skin.   Apply moist hot packs to your breast for 5 to 10 minutes before each feeding. This increases circulation and helps the milk flow.   Gently massage your breast before and during the feeding.   Make sure that the baby empties at least one breast at every feeding before switching sides.   Use a breast pump to empty the breasts if your baby is sleepy or not nursing well. You may also want to pump if you are returning to work or or you feel you are getting engorged.   Avoid bottle feeds, pacifiers or supplemental feedings of water or juice in place of breastfeeding.   Be sure the baby is latched on and positioned properly while  breastfeeding.   Prevent fatigue, stress, and anemia.   Wear a supportive bra, avoiding underwire styles.   Eat a balanced diet with enough fluids.  If you follow these suggestions, your engorgement should improve in 24 to 48 hours. If you are still experiencing difficulty, call your lactation consultant or caregiver. IS MY BABY GETTING ENOUGH MILK? Sometimes, mothers worry about whether their babies are getting enough milk. You can be assured that your baby is getting enough milk if:  The baby is actively sucking and you hear swallowing.   The baby nurses at least 8 to 12 times in a 24 hour time period. Nurse your baby until he or she unlatches or falls asleep at the first breast (at least 10 to 20 minutes), then offer the second side.   The baby is wetting 5 to 6 disposable diapers (6 to 8 cloth diapers) in a 24 hour period by 5 to 6 days of age.   The baby is having at least 2 to 3 stools every 24 hours for the first few months. Breast milk is all the food your baby needs. It is not necessary for your baby to have water or formula. In fact, to help your breasts make more milk, it is best not to give your baby supplemental feedings during the early weeks.   The stool should be soft and yellow.   The baby should gain 4 to 7 ounces per week after he is 4 days old.  TAKE CARE OF YOURSELF Take care of your breasts by:  Bathing or showering daily.   Avoiding the use of soaps on your nipples.   Start feedings on your left breast at one feeding and on your right breast at the next feeding.   You will notice an increase in your milk supply 2 to 5 days after delivery. You may feel some discomfort from engorgement, which makes your breasts very firm and often tender. Engorgement "peaks" out within 24 to 48 hours. In the meantime, apply warm moist towels to your breasts for 5 to 10 minutes before feeding. Gentle massage and expression of some milk before feeding will soften your breasts, making  it easier for your baby to latch on. Wear a well fitting nursing bra and air dry your nipples for 10 to 15 minutes after each feeding.   Only use cotton bra pads.   Only use pure lanolin on your nipples after nursing. You do not need to wash it off before nursing.  Take care of yourself by:   Eating well-balanced meals and nutritious snacks.   Drinking milk, fruit juice, and water to satisfy your thirst (about 8 glasses a day).   Getting plenty of rest.   Increasing calcium in   your diet (1200 mg a day).   Avoiding foods that you notice affect the baby in a bad way.  SEEK MEDICAL CARE IF:   You have any questions or difficulty with breastfeeding.   You need help.   You have a hard, red, sore area on your breast, accompanied by a fever of 100.5 F (38.1 C) or more.   Your baby is too sleepy to eat well or is having trouble sleeping.   Your baby is wetting less than 6 diapers per day, by 16 days of age.   Your baby's skin or white part of his or her eyes is more yellow than it was in the hospital.   You feel depressed.  Document Released: 04/09/2005 Document Revised: 03/29/2011 Document Reviewed: 11/22/2008 [facility] Patient Information 2012 [place], [place].Cesarean Delivery Care After Refer to this sheet in the next few weeks. These instructions provide you with information on caring for yourself after your procedure. Your caregiver may also give you more specific instructions. Your treatment has been planned according to current medical practices, but problems sometimes occur. Call your caregiver if you have any problems or questions after your procedure. HOME CARE INSTRUCTIONS Healing will take time. You will have discomfort, tenderness, swelling, and bruising at the surgery site for a couple of weeks. This is normal and will get better as time goes on. Activity  Rest as much as possible the first 2 weeks.   When possible, have someone help you with your household activities  and your baby for 2 to 3 weeks.   Limit your housework and social activity. Increase your activity gradually as your strength returns.   Do not climb stairs more than 2 to 3 times a day.   Do not lift anything heavier than your baby.   Follow your caregiver's instructions about driving a car.   Exercise only as directed by your caregiver.  Nutrition  You may return to your usual diet. Eat a well-balanced diet.   Drink enough fluid to keep your urine clear or pale yellow.   Keep taking your prenatal or multivitamins.   Do not drink alcohol until your caregiver says it is okay.  Elimination You should return to your usual bowel function. If you develop constipation, ask your caregiver about taking a mild laxative that will help you go to the bathroom. Bran foods and fluids help with constipation. Gradually add fruit, vegetables, and bran to your diet.  Hygiene  You may shower, wash your hair, and take tub baths unless your caregiver tells you otherwise.   Continue perineal care until your vaginal bleeding and discharge stops.   Do not douche or use tampons until your caregiver says it is okay.  Fever If you feel feverish or have shaking chills, take your temperature. The fever may indicate infection. Infections can be treated with antibiotic medicine. Pain Control and Medicine  Only take over-the-counter or prescription medicine as directed by your caregiver. Do not take aspirin. It can cause bleeding.   Do not drive when taking pain medicine.   Talk to your caregiver about restarting or adjusting your normal medicines.  Incision Care  Clean your cut (incision) gently with soap and water, then pat dry.   If your caregiver says it is okay, leave the incision without a bandage (dressing) unless it is draining fluid or irritated.   If you have small adhesive strips across the incision and they do not fall off within 7 days, carefully peel them off.  Check the incision daily  for increased redness, drainage, swelling, or separation of skin.   Hug a pillow when coughing or sneezing. This helps to relieve pain.  Vaginal Care You may have a vaginal discharge or bleeding for up to 6 weeks. If the vaginal discharge becomes bright red, bad smelling, heavy in amount, has blood clots, or if you have burning or frequent urination, call your caregiver.If your bleeding slows down and then gets heavier, your body is telling you to slow down and relax more. Sexual Intercourse  Check with your caregiver before resuming sexual activity. Often, after 4 to 6 weeks, if you feel good and are well rested, sexual activity may be resumed. Avoid positions that strain the incision site.   You can become pregnant before you have a period. If you decide to have sexual intercourse, use birth control if you do not want to become pregnant right away.  Health Practices  Keep all your postpartum appointments as recommended by your caregiver. Generally, your caregiver will want to see you in 2 to 3 weeks.   Continue with your yearly pelvic exams.   Continue monthly self-breast exams and yearly physical exams with a Pap test.  Breast Care  If you are not breastfeeding and your breasts become tender, hard, or leak milk, you may wear a tight-fitting bra and apply ice to your breasts.   If you are breastfeeding, wear a good support bra.   Call your caregiver if you have breast pain, flu-like symptoms, fever, or hardness and reddening of your breasts.  Postpartum Blues You may have a period of low spirits or "blues" after your baby is born. Discuss your feelings with your partner, family, and friends. This may be caused by the changing hormone levels in your body. You may want to contact your caregiver if this is worrisome. Miscellaneous  Limit wearing support panties or control-top hose.   If you breastfeed, you may not have a period for several months or longer. This is normal for the  nursing mother. If you do not menstruate within 6 weeks after you stop breastfeeding, see your caregiver.   If you are not breastfeeding, you can expect to menstruate within 6 to 10 weeks after birth. If you have not started by the 11th week, check with your caregiver.  SEEK MEDICAL CARE IF:   There is swelling, redness, or increasing pain in the wound area.   You have pus coming from the wound.   You notice a bad smell from the wound or surgical dressing.   You have pain, redness, and swelling from the intravenous (IV) site.   Your wound breaks open (the edges are not staying together).   You feel dizzy or feel like fainting.   You develop pain or bleeding when you urinate.   You develop diarrhea.   You develop nausea and vomiting.   You develop abnormal vaginal discharge.   You develop a rash.   You have any type of abnormal reaction or develop an allergy to your medicine.   Your pain is not relieved by your medicine or becomes worse.   Your temperature is 101 F (38.3 C), or is 100.4 F (38 C) taken 2 times in a 4 hour period.  SEEK IMMEDIATE MEDICAL CARE:  You develop a temperature of 102 F (38.9 C) or higher.   You develop abdominal pain.   You develop chest pain.   You develop shortness of breath.   You faint.  You develop pain, swelling, or redness of your leg.   You develop heavy vaginal bleeding with or without blood clots.  Document Released: 12/30/2001 Document Revised: 03/29/2011 Document Reviewed: 07/05/2010 [facility] Patient Information 2012 [place], [place].

## 2011-07-15 NOTE — Discharge Summary (Signed)
   Obstetric Discharge Summary Reason for Admission: rupture of membranes Prenatal Procedures: NST and ultrasound Intrapartum Procedures: cesarean: low cervical, transverse, GBS prophylaxis and labor epidural Postpartum Procedures: none Complications-Operative and Postpartum: SROM x24 hours, failure to progress   Temp:  [97.8 F (36.6 C)-98.2 F (36.8 C)] 97.9 F (36.6 C) (03/24 0516) Pulse Rate:  [83-93] 83  (03/24 0516) Resp:  [18-20] 18  (03/24 0516) BP: (96-118)/(66-71) 96/66 mmHg (03/24 0516) SpO2:  [96 %] 96 % (03/24 0516) Hemoglobin  Date Value Range Status  07/12/2011 10.7* 12.0-15.0 (g/dL) Final     HCT  Date Value Range Status  07/12/2011 32.6* 36.0-46.0 (%) Final    Hospital Course:   Admitted for SROM without active labor.  pos GBS. PCN for prophylaxis.  Pitocin augmentation was started and IUPC placed. Pt progressed to 9cm, without further progress despite adequate labor. LTCS was performed by Dr. Pennie Rushing without difficulty. Patient and baby tolerated the procedure well. Infant to FTN. Mother and infant then had an uncomplicated postpartum course, with pt pumping breast milk to give to baby. Mom's physical exam was WNL, and she was discharged home in stable condition. Contraception plan was nexplanon.  She received adequate benefit from po pain medications.  Discharge Diagnoses: Term pregnancy delivered, and SROM x24hours Discharge Information: Date: 07/15/2011 Activity: pelvic rest Diet: routine Medications:  Medication List  As of 07/15/2011  9:17 AM   START taking these medications         ibuprofen 600 MG tablet   Commonly known as: ADVIL,MOTRIN   Take 1 tablet (600 mg total) by mouth every 6 (six) hours as needed for pain.      oxyCODONE-acetaminophen 5-325 MG per tablet   Commonly known as: PERCOCET   Take 1-2 tablets by mouth every 4 (four) hours as needed (moderate - severe pain).         CONTINUE taking these medications         hydrocortisone cream  1 %      prenatal multivitamin Tabs         STOP taking these medications         BENADRYL 25 mg capsule          Where to get your medications    These are the prescriptions that you need to pick up.   You may get these medications from any pharmacy.         ibuprofen 600 MG tablet   oxyCODONE-acetaminophen 5-325 MG per tablet           Condition: stable Instructions: refer to practice specific booklet Discharge to: home Follow-up Information    Follow up with [facility]. Schedule an appointment as soon as possible for a visit in 5 weeks. (Schedule postpartum visit in 5 weeks, and a visit to place nexplanon in 6 weeks )          Newborn Data: Live born  Information for the patient's newborn:  Airyn, Ellzey [[id number]]  female ; APGAR , 9, 9  ; weight ; 8#3oz  Home with mother.  Jeron Grahn M 07/15/2011, 9:17 AM

## 2011-07-16 ENCOUNTER — Encounter (HOSPITAL_COMMUNITY): Payer: Self-pay | Admitting: Obstetrics and Gynecology

## 2011-08-23 ENCOUNTER — Ambulatory Visit (INDEPENDENT_AMBULATORY_CARE_PROVIDER_SITE_OTHER): Payer: Medicaid Other | Admitting: Obstetrics and Gynecology

## 2011-08-23 ENCOUNTER — Encounter: Payer: Self-pay | Admitting: Obstetrics and Gynecology

## 2011-08-23 ENCOUNTER — Ambulatory Visit: Payer: Medicaid Other | Admitting: Obstetrics and Gynecology

## 2011-08-23 ENCOUNTER — Telehealth: Payer: Self-pay | Admitting: Obstetrics and Gynecology

## 2011-08-23 DIAGNOSIS — Z22330 Carrier of Group B streptococcus: Secondary | ICD-10-CM

## 2011-08-23 DIAGNOSIS — IMO0002 Reserved for concepts with insufficient information to code with codable children: Secondary | ICD-10-CM

## 2011-08-23 DIAGNOSIS — Z3400 Encounter for supervision of normal first pregnancy, unspecified trimester: Secondary | ICD-10-CM

## 2011-08-23 MED ORDER — ETONOGESTREL 68 MG ~~LOC~~ IMPL: 68.0000 mg | DRUG_IMPLANT | Freq: Once | SUBCUTANEOUS | Status: DC

## 2011-08-23 NOTE — Telephone Encounter (Signed)
Routed to triage 

## 2011-08-23 NOTE — Progress Notes (Signed)
Menses 4/27, DR 3 FB abd soft, rounded, EGBUS wnl, vag tone good, cervix LTC, uterus sm firm Normal involution F/o 1 week nexplanon Lavera Guise, CNM

## 2011-08-23 NOTE — Progress Notes (Signed)
Patient ID: Madison Perry, female   DOB: 1991/02/06, 21 y.o.   MRN: [id number] Date of delivery: 07/12/2011 Female Name: Jerrye Beavers Vaginal delivery:no Cesarean section:yes Tubal ligation:no GDM:no Breast Feeding:no Bottle Feeding:yes Post-Partum Blues:no Abnormal pap:yes Normal GU function: yes Normal GI function:yes Returning to work:yes; will be returning back to work this coming Monday  EPDS 2   Wants to discuss contraception (Nexplanon).  Pamphlet provided.

## 2014-02-14 ENCOUNTER — Emergency Department (HOSPITAL_COMMUNITY): Payer: Medicaid Other

## 2014-02-14 ENCOUNTER — Encounter (HOSPITAL_COMMUNITY): Payer: Self-pay | Admitting: Emergency Medicine

## 2014-02-14 ENCOUNTER — Emergency Department (HOSPITAL_COMMUNITY)
Admission: EM | Admit: 2014-02-14 | Discharge: 2014-02-14 | Disposition: A | Discharge status: Home / Self Care | Payer: Self-pay | Attending: Emergency Medicine | Admitting: Emergency Medicine

## 2014-02-14 DIAGNOSIS — J069 Acute upper respiratory infection, unspecified: Secondary | ICD-10-CM | POA: Insufficient documentation

## 2014-02-14 DIAGNOSIS — Z872 Personal history of diseases of the skin and subcutaneous tissue: Secondary | ICD-10-CM | POA: Insufficient documentation

## 2014-02-14 DIAGNOSIS — E669 Obesity, unspecified: Secondary | ICD-10-CM | POA: Insufficient documentation

## 2014-02-14 DIAGNOSIS — Z87891 Personal history of nicotine dependence: Secondary | ICD-10-CM | POA: Insufficient documentation

## 2014-02-14 DIAGNOSIS — Z8742 Personal history of other diseases of the female genital tract: Secondary | ICD-10-CM | POA: Insufficient documentation

## 2014-02-14 DIAGNOSIS — M25511 Pain in right shoulder: Secondary | ICD-10-CM | POA: Insufficient documentation

## 2014-02-14 DIAGNOSIS — M25512 Pain in left shoulder: Secondary | ICD-10-CM | POA: Insufficient documentation

## 2014-02-14 DIAGNOSIS — B999 Unspecified infectious disease: Secondary | ICD-10-CM | POA: Insufficient documentation

## 2014-02-14 LAB — RAPID STREP SCREEN (MED CTR MEBANE ONLY): STREPTOCOCCUS, GROUP A SCREEN (DIRECT): NEGATIVE

## 2014-02-14 MED ORDER — NAPROXEN 500 MG PO TABS: 500.0000 mg | ORAL_TABLET | Freq: Two times a day (BID) | ORAL | Status: DC

## 2014-02-14 MED ORDER — ACETAMINOPHEN 500 MG PO TABS
1000.0000 mg | ORAL_TABLET | Freq: Once | ORAL | Status: AC
  Administered 2014-02-14: 1000 mg via ORAL
  Filled 2014-02-14: qty 2

## 2014-02-14 MED ORDER — NAPROXEN 250 MG PO TABS
500.0000 mg | ORAL_TABLET | Freq: Once | ORAL | Status: AC
  Administered 2014-02-14: 500 mg via ORAL
  Filled 2014-02-14: qty 2

## 2014-02-14 NOTE — ED Notes (Signed)
Patient returned from xray, placed back on monitor.

## 2014-02-14 NOTE — ED Notes (Signed)
Patient has had headache since Monday, has had migraines in past.  Has taken Excedrin migraine with some relief but it wears off.  Also c/o throat pain, pain with swallowing since yesterday, throat is red.  C/o back pain, lower right thoracic region, hurts worse with inspiration, 5/10, since Thursday.  No cough, fevers, had a runny nose but it went away.

## 2014-02-14 NOTE — ED Notes (Signed)
Pt presents to department for evaluation of headache, sore throat and difficulty swallowing. Ongoing for several days. Respirations unlabored. Pt is alert and oriented x4.

## 2014-02-14 NOTE — ED Provider Notes (Signed)
CSN: [id number]     Arrival date & time 02/14/14  0941 History   First MD Initiated Contact with Patient 02/14/14 [phone]     Chief Complaint  Patient presents with  . Headache  . Sore Throat     (Consider location/radiation/quality/duration/timing/severity/associated sxs/prior Treatment) HPI Comments: The patient is a 23 year old female who has no significant past medical history. She presents with sore throat, headache and shoulder pain with deep breathing which started over the last 2 days. Initially she noted that only her right shoulder was hurting with breathing, this now involves her left shoulder as well. She denies any coughing but does have a sore throat which is progressively getting worse and pain with swallowing. She has not had a fever, she does have a mild headache. She denies any other risk factors for pulmonary embolism. She has had no medications prior to arrival, denies vomiting diarrhea abdominal pain dysuria rashes numbness weakness or chills. Symptoms are persistent and gradually worsening. She does note that her husband has had bronchitis over the last year and in fact comes in with her today to be evaluated as well for his coughing  Patient is a 23 y.o. female presenting with headaches and pharyngitis. The history is provided by the patient.  Headache Sore Throat Associated symptoms include headaches.    Past Medical History  Diagnosis Date  . No pertinent past medical history   . Obese   . Varicella     at age 89 or 89  . Eczema   . Yeast infection of the vagina     frequent  . Abnormal Pap smear    Past Surgical History  Procedure Laterality Date  . Wisdom tooth extraction    . Wisdom tooth extraction    . No past surgeries    . Cesarean section  07/12/2011    Procedure: CESAREAN SECTION;  Surgeon: Hal MoralesVanessa P Haygood, MD;  Location: [facility];  Service: Gynecology;  Laterality: N/A;  Primary Cesarean Section Delivery Baby Girl @ [phone], Apgars 9/9   Family History   Problem Relation Age of Onset  . Hypertension Father   . Diabetes Paternal Grandmother   . Hypertension Paternal Grandmother   . Heart disease Paternal Grandmother    History  Substance Use Topics  . Smoking status: Former Games developermoker  . Smokeless tobacco: Never Used  . Alcohol Use: No   OB History   Grav Para Term Preterm Abortions TAB SAB Ect Mult Living   1 1 1  0 0 0 0 0 0 1     Review of Systems  Neurological: Positive for headaches.  All other systems reviewed and are negative.     Allergies  Review of patient's allergies indicates no known allergies.  Home Medications   Prior to Admission medications   Medication Sig Start Date End Date Taking? Authorizing Provider  aspirin-acetaminophen-caffeine (EXCEDRIN MIGRAINE) [phone] MG per tablet Take 1-2 tablets by mouth every 6 (six) hours as needed for headache.   Yes Historical Provider, MD  OVER THE COUNTER MEDICATION Take 2 capsules by mouth every 6 (six) hours as needed (for cough and cold symptoms). "XL-3" contains 5mg  phenylephrine, 2 mg chlorpheniramine, 10 mg dextromethorphan   Yes Historical Provider, MD  etonogestrel (IMPLANON) 68 MG IMPL implant Inject 1 each (68 mg total) into the skin once. 08/23/11 08/22/12  Lavera GuiseMary Krebsbach, CNM  naproxen (NAPROSYN) 500 MG tablet Take 1 tablet (500 mg total) by mouth 2 (two) times daily with a meal. 02/14/14  Vida RollerBrian D Mieka Leaton, MD   BP 102/51  Pulse 85  Temp(Src) 98.6 F (37 C) (Oral)  Resp 20  SpO2 99%  LMP 02/07/2014 Physical Exam  Nursing note and vitals reviewed. Constitutional: She appears well-developed and well-nourished. No distress.  HENT:  Head: Normocephalic and atraumatic.  Mouth/Throat: No oropharyngeal exudate.  Bilateral tonsillar and pharyngeal erythema without exudate asymmetry or hypertrophy, uvula midline, mucous membranes moist, phonation normal. Nasal passages clear, tympanic membranes clear bilaterally  Eyes: Conjunctivae and EOM are normal. Pupils are  equal, round, and reactive to light. Right eye exhibits no discharge. Left eye exhibits no discharge. No scleral icterus.  Neck: Normal range of motion. Neck supple. No JVD present. No thyromegaly present.  Very supple neck, no lymphadenopathy  Cardiovascular: Normal rate, regular rhythm, normal heart sounds and intact distal pulses.  Exam reveals no gallop and no friction rub.   No murmur heard. Pulmonary/Chest: Effort normal and breath sounds normal. No respiratory distress. She has no wheezes. She has no rales.  Abdominal: Soft. Bowel sounds are normal. She exhibits no distension and no mass. There is no tenderness.  Musculoskeletal: Normal range of motion. She exhibits no edema and no tenderness.  Lymphadenopathy:    She has no cervical adenopathy.  Neurological: She is alert. Coordination normal.  Skin: Skin is warm and dry. No rash noted. No erythema.  Psychiatric: She has a normal mood and affect. Her behavior is normal.    ED Course  Procedures (including critical care time) Labs Review Labs Reviewed  RAPID STREP SCREEN  CULTURE, GROUP A STREP    Imaging Review Dg Chest 2 View  02/14/2014   CLINICAL DATA:  Posterior chest pain between scapulae for 3 days worse with walking and deep breathing  EXAM: CHEST  2 VIEW  COMPARISON:  None.  FINDINGS: Normal heart size, mediastinal contours, and pulmonary vascularity.  Mild peribronchial thickening.  No pulmonary infiltrate, pleural effusion, or pneumothorax.  Bones unremarkable.  IMPRESSION: Mild bronchitic changes.  No acute abnormalities.   Electronically Signed   By: Ulyses SouthwardMark  Boles M.D.   On: 02/14/2014 11:43      MDM   Final diagnoses:  Infection  URI (upper respiratory infection)    The patient has clear lung sounds, normal vital signs, no abdominal tenderness, no other sources for infection. Suspect upper respiratory infection, check chest x-ray to rule out pneumothorax with her pain radiating to the shoulders with breathing,  additionally check strep, I have already performed a swab.  Strep test negative, chest x-ray without acute findings, medications given as below, patient appears stable for discharge   Meds given in ED:  Medications  naproxen (NAPROSYN) tablet 500 mg (not administered)  acetaminophen (TYLENOL) tablet 1,000 mg (1,000 mg Oral Given 02/14/14 1019)    New Prescriptions   NAPROXEN (NAPROSYN) 500 MG TABLET    Take 1 tablet (500 mg total) by mouth 2 (two) times daily with a meal.      Vida RollerBrian D Kadi Hession, MD 02/14/14 1208

## 2014-02-14 NOTE — Discharge Instructions (Signed)
Your tests look normal, normal xray and you don't have strep  Please call your doctor for a followup appointment within 24-48 hours. When you talk to your doctor please let them know that you were seen in the emergency department and have them acquire all of your records so that they can discuss the findings with you and formulate a treatment plan to fully care for your new and ongoing problems.

## 2014-02-16 LAB — CULTURE, GROUP A STREP

## 2014-02-22 ENCOUNTER — Encounter (HOSPITAL_COMMUNITY): Payer: Self-pay | Admitting: Emergency Medicine

## 2014-08-24 LAB — OB RESULTS CONSOLE RPR: RPR: NONREACTIVE

## 2014-08-24 LAB — OB RESULTS CONSOLE HEPATITIS B SURFACE ANTIGEN: HEP B S AG: NEGATIVE

## 2014-08-24 LAB — OB RESULTS CONSOLE ANTIBODY SCREEN: Antibody Screen: NEGATIVE

## 2014-08-24 LAB — OB RESULTS CONSOLE GC/CHLAMYDIA
Chlamydia: NEGATIVE
Gonorrhea: NEGATIVE

## 2014-08-24 LAB — OB RESULTS CONSOLE HIV ANTIBODY (ROUTINE TESTING): HIV: NONREACTIVE

## 2014-08-24 LAB — OB RESULTS CONSOLE ABO/RH: RH TYPE: POSITIVE

## 2014-08-24 LAB — OB RESULTS CONSOLE GBS: GBS: NEGATIVE

## 2014-08-24 LAB — OB RESULTS CONSOLE RUBELLA ANTIBODY, IGM: Rubella: NON-IMMUNE/NOT IMMUNE

## 2015-02-08 ENCOUNTER — Other Ambulatory Visit: Payer: Self-pay | Admitting: Obstetrics and Gynecology

## 2015-03-16 NOTE — Patient Instructions (Signed)
Your procedure is scheduled on:  Wednesday, Nov. 30, 2016  [facility] at:  9:45 AM  Pick up the phone at the desk and dial [phone].  Call this number if you have problems the morning of surgery: [phone].  Remember: Do NOT eat food or drink after:  Midnight Tuesday Take these medicines the morning of surgery with a SIP OF WATER:  None  Do NOT wear jewelry (body piercing), metal hair clips/bobby pins,or nail polish. Do NOT wear lotions, powders, or perfumes.  You may wear deoderant. Do NOT shave for 48 hours prior to surgery. Do NOT bring valuables to the hospital. Leave suitcase in car.  After surgery it may be brought to your room.  For patients admitted to the hospital, checkout time is 11:00 AM the day of discharge.

## 2015-03-19 NOTE — H&P (Signed)
Madison Perry is a 24 y.o. female, G2P1001 at 40 1/7 weeks, presenting on 03/23/15 for scheduled repeat C/S, declines BTL.  Denies leaking or bleeding, reports +FM.  Patient Active Problem List   Diagnosis Date Noted  . Previous cesarean section--FTP to 9 cm 03/20/2015  . BMI 40.0-44.9, adult (HCC) 03/20/2015  . Rubella non-immune status, antepartum 03/20/2015  . LGA (large for gestational age) fetus--9+3 at 33 weeks. 03/20/2015    History of present pregnancy: Patient entered care at 13 4/7 weeks.   EDC of 03/29/15 was established by [place] at 11 weeks.   Anatomy scan:  19 weeks, with limited anatomy and an anterior right lateral placenta.   Additional [place] evaluations:   23 5/7 weeks:  EFW 1+7, 70%ile, completion of anatomy, cervix 3.57.  37 3/7 weeks:  EFW 9+3, >97%ile, AFI 16.77, 60%ile, BPP 8/8, vtx. 38 1/7 weeks:  AFI 17.31, vtx, BPP 8/8 Significant prenatal events:  Declined genetic testing.  Initially desired TOLAC, then elected to schedule C/S.  Declined TDAP and flu vaccines.  Declined BTL with C/S, may plan vasectomy.   Last evaluation:  03/16/15--Cervix closed, long, vtx, -3, BPP 8/8, normal fluid.  Desired no additional appts prior to C/S.  OB History    Gravida Para Term Preterm AB TAB SAB Ectopic Multiple Living   0 0 0 0 0 0 1    2013--Primary LTCS due to arrest of labor at 9 cm, 38 3/7 weeks, female, 8+3, epidural, 2 layer closure with Dr. Pennie Rushing,   Past Medical History  Diagnosis Date  . No pertinent past medical history   . Obese   . Varicella     at age 84 or 22  . Eczema   . Yeast infection of the vagina     frequent  . Abnormal Pap smear    Past Surgical History  Procedure Laterality Date  . Wisdom tooth extraction    . Wisdom tooth extraction    . No past surgeries    . Cesarean section  07/12/2011    Procedure: CESAREAN SECTION;  Surgeon: Hal Morales, MD;  Location: [facility];  Service: Gynecology;  Laterality: N/A;  Primary Cesarean Section  Delivery Baby Girl @ [phone], Apgars 9/9   Family History: family history includes Diabetes in her paternal grandmother; Heart disease in her paternal grandmother; Hypertension in her father and paternal grandmother.   Social History:  reports that she has quit smoking. She has never used smokeless tobacco. She reports that she does not drink alcohol or use illicit drugs.  Patient is Caucasian, high-school educated to 10th grade, homemaker, married to [employer].   Prenatal Transfer Tool  Maternal Diabetes: No Genetic Screening: Declined Maternal Ultrasounds/Referrals: Normal Fetal Ultrasounds or other Referrals:  None Maternal Substance Abuse:  No Significant Maternal Medications:  None Significant Maternal Lab Results: Lab values include: Group B Strep negative  TDAP Declined Flu Declined  ROS:  +FM, occasional UCs.  No Known Allergies     Chest clear Heart RRR without murmur Abd gravid, NT, FH 44 cm Pelvic: on 03/16/15--cervix closed, long, vtx, -3 Ext: Trace edema  FHR: 139 at last visit 03/16/15 UCs:  Occasional, mild  Prenatal labs: ABO, Rh:  A+ Antibody:  Neg Rubella:  Non-immune RPR:   NR HBsAg:  Neg  HIV:   NR GBS:  Negative 03/02/15 Sickle cell/Hgb electrophoresis:  NA Pap:  Normal 08/2014 GC:  Negative 08/24/14, 03/02/15 Chlamydia:  Negative 08/24/14. 03/02/15 Genetic screenings:  Declined Glucola:  WNL Other:   Hgb 13.6 at NOB, 11.1 at 28 weeks   Assessment/Plan: IUP at 39 1/7 weeks Previous C/S, desires repeat. BMI 42 Rubella non-immune GBS negative LGA infant--EFW 9+3 at 37 weeks  [facility]: [facility] with Dr. Su Hiltoberts Routine [facility] pre-op orders  Nyra CapesLATHAM, VICKICNM, [place] 03/20/2015, 12:19 AM

## 2015-03-20 ENCOUNTER — Encounter (HOSPITAL_COMMUNITY): Payer: Self-pay | Admitting: Obstetrics and Gynecology

## 2015-03-20 DIAGNOSIS — O09899 Supervision of other high risk pregnancies, unspecified trimester: Secondary | ICD-10-CM

## 2015-03-20 DIAGNOSIS — IMO0002 Reserved for concepts with insufficient information to code with codable children: Secondary | ICD-10-CM | POA: Diagnosis present

## 2015-03-20 DIAGNOSIS — O9989 Other specified diseases and conditions complicating pregnancy, childbirth and the puerperium: Secondary | ICD-10-CM

## 2015-03-20 DIAGNOSIS — Z6841 Body Mass Index (BMI) 40.0 and over, adult: Secondary | ICD-10-CM

## 2015-03-20 DIAGNOSIS — Z98891 History of uterine scar from previous surgery: Secondary | ICD-10-CM

## 2015-03-20 DIAGNOSIS — Z283 Underimmunization status: Secondary | ICD-10-CM

## 2015-03-21 ENCOUNTER — Encounter (HOSPITAL_COMMUNITY): Payer: Self-pay

## 2015-03-21 ENCOUNTER — Encounter (HOSPITAL_COMMUNITY)
Admission: RE | Admit: 2015-03-21 | Discharge: 2015-03-21 | Disposition: A | Discharge status: Home / Self Care | Payer: Medicaid Other | Source: Ambulatory Visit | Attending: Obstetrics and Gynecology | Admitting: Obstetrics and Gynecology

## 2015-03-21 DIAGNOSIS — Z01818 Encounter for other preprocedural examination: Secondary | ICD-10-CM | POA: Insufficient documentation

## 2015-03-21 HISTORY — DX: Headache, unspecified: R51.9

## 2015-03-21 HISTORY — DX: Headache: R51

## 2015-03-21 LAB — CBC
HCT: 33.3 % — ABNORMAL LOW (ref 36.0–46.0)
Hemoglobin: 10.6 g/dL — ABNORMAL LOW (ref 12.0–15.0)
MCH: 26.1 pg (ref 26.0–34.0)
MCHC: 31.8 g/dL (ref 30.0–36.0)
MCV: 82 fL (ref 78.0–100.0)
PLATELETS: 250 10*3/uL (ref 150–400)
RBC: 4.06 MIL/uL (ref 3.87–5.11)
RDW: 13.8 % (ref 11.5–15.5)
WBC: 9.8 10*3/uL (ref 4.0–10.5)

## 2015-03-21 LAB — ABO/RH: ABO/RH(D): A POS

## 2015-03-21 NOTE — Pre-Procedure Instructions (Signed)
Spoke to [place] at Dr. Su Hiltoberts office about patient stating she has noticed underwear wet with clear sticky discharge all day yesterday.  Cordelia PenSherry will contact patient to come to office for appointment today.

## 2015-03-22 LAB — RPR: RPR Ser Ql: NONREACTIVE

## 2015-03-23 ENCOUNTER — Inpatient Hospital Stay (HOSPITAL_COMMUNITY): Payer: Medicaid Other | Admitting: Anesthesiology

## 2015-03-23 ENCOUNTER — Inpatient Hospital Stay (HOSPITAL_COMMUNITY)
Admission: RE | Admit: 2015-03-23 | Discharge: 2015-03-25 | DRG: 765 | Disposition: A | Discharge status: Home / Self Care | Payer: Medicaid Other | Source: Ambulatory Visit | Attending: Obstetrics and Gynecology | Admitting: Obstetrics and Gynecology

## 2015-03-23 ENCOUNTER — Encounter (HOSPITAL_COMMUNITY)
Admission: RE | Disposition: A | Discharge status: Home / Self Care | Payer: Self-pay | Source: Ambulatory Visit | Attending: Obstetrics and Gynecology

## 2015-03-23 ENCOUNTER — Encounter (HOSPITAL_COMMUNITY): Payer: Self-pay | Admitting: Emergency Medicine

## 2015-03-23 DIAGNOSIS — O34211 Maternal care for low transverse scar from previous cesarean delivery: Secondary | ICD-10-CM | POA: Diagnosis present

## 2015-03-23 DIAGNOSIS — Z833 Family history of diabetes mellitus: Secondary | ICD-10-CM

## 2015-03-23 DIAGNOSIS — O34219 Maternal care for unspecified type scar from previous cesarean delivery: Secondary | ICD-10-CM | POA: Diagnosis present

## 2015-03-23 DIAGNOSIS — Z87891 Personal history of nicotine dependence: Secondary | ICD-10-CM

## 2015-03-23 DIAGNOSIS — Z6841 Body Mass Index (BMI) 40.0 and over, adult: Secondary | ICD-10-CM

## 2015-03-23 DIAGNOSIS — Z3A39 39 weeks gestation of pregnancy: Secondary | ICD-10-CM

## 2015-03-23 DIAGNOSIS — Z8249 Family history of ischemic heart disease and other diseases of the circulatory system: Secondary | ICD-10-CM

## 2015-03-23 DIAGNOSIS — Z283 Underimmunization status: Secondary | ICD-10-CM

## 2015-03-23 DIAGNOSIS — O99214 Obesity complicating childbirth: Secondary | ICD-10-CM | POA: Diagnosis present

## 2015-03-23 DIAGNOSIS — Z2839 Other underimmunization status: Secondary | ICD-10-CM

## 2015-03-23 DIAGNOSIS — IMO0002 Reserved for concepts with insufficient information to code with codable children: Secondary | ICD-10-CM | POA: Diagnosis present

## 2015-03-23 DIAGNOSIS — O3663X0 Maternal care for excessive fetal growth, third trimester, not applicable or unspecified: Principal | ICD-10-CM | POA: Diagnosis present

## 2015-03-23 DIAGNOSIS — O9989 Other specified diseases and conditions complicating pregnancy, childbirth and the puerperium: Secondary | ICD-10-CM

## 2015-03-23 DIAGNOSIS — Z98891 History of uterine scar from previous surgery: Secondary | ICD-10-CM

## 2015-03-23 LAB — PREPARE RBC (CROSSMATCH)

## 2015-03-23 SURGERY — Surgical Case
Anesthesia: Spinal

## 2015-03-23 MED ORDER — FENTANYL CITRATE (PF) 100 MCG/2ML IJ SOLN: 25.0000 ug | INTRAMUSCULAR | Status: DC | PRN

## 2015-03-23 MED ORDER — OXYTOCIN 10 UNIT/ML IJ SOLN
INTRAMUSCULAR | Status: AC
  Filled 2015-03-23: qty 4

## 2015-03-23 MED ORDER — ZOLPIDEM TARTRATE 5 MG PO TABS: 5.0000 mg | ORAL_TABLET | Freq: Every evening | ORAL | Status: DC | PRN

## 2015-03-23 MED ORDER — SCOPOLAMINE 1 MG/3DAYS TD PT72
MEDICATED_PATCH | TRANSDERMAL | Status: AC
  Administered 2015-03-23: 1.5 mg via TRANSDERMAL
  Filled 2015-03-23: qty 1

## 2015-03-23 MED ORDER — MEPERIDINE HCL 25 MG/ML IJ SOLN: 6.2500 mg | INTRAMUSCULAR | Status: DC | PRN

## 2015-03-23 MED ORDER — NALBUPHINE HCL 10 MG/ML IJ SOLN: 5.0000 mg | Freq: Once | INTRAMUSCULAR | Status: DC | PRN

## 2015-03-23 MED ORDER — PRENATAL MULTIVITAMIN CH
1.0000 | ORAL_TABLET | Freq: Every day | ORAL | Status: DC
  Administered 2015-03-24: 1 via ORAL
  Filled 2015-03-23: qty 1

## 2015-03-23 MED ORDER — SCOPOLAMINE 1 MG/3DAYS TD PT72
1.0000 | MEDICATED_PATCH | Freq: Once | TRANSDERMAL | Status: DC
  Administered 2015-03-23: 1.5 mg via TRANSDERMAL

## 2015-03-23 MED ORDER — ERYTHROMYCIN 5 MG/GM OP OINT
TOPICAL_OINTMENT | OPHTHALMIC | Status: AC
  Filled 2015-03-23: qty 1

## 2015-03-23 MED ORDER — PHENOL 1.4 % MT LIQD
1.0000 | OROMUCOSAL | Status: DC | PRN
  Administered 2015-03-23: 1 via OROMUCOSAL
  Filled 2015-03-23: qty 177

## 2015-03-23 MED ORDER — NALBUPHINE HCL 10 MG/ML IJ SOLN: 5.0000 mg | INTRAMUSCULAR | Status: DC | PRN

## 2015-03-23 MED ORDER — ONDANSETRON HCL 4 MG/2ML IJ SOLN: 4.0000 mg | Freq: Once | INTRAMUSCULAR | Status: DC | PRN

## 2015-03-23 MED ORDER — SIMETHICONE 80 MG PO CHEW
80.0000 mg | CHEWABLE_TABLET | Freq: Three times a day (TID) | ORAL | Status: DC
  Administered 2015-03-23 – 2015-03-25 (×5): 80 mg via ORAL
  Filled 2015-03-23 (×4): qty 1

## 2015-03-23 MED ORDER — OXYTOCIN 40 UNITS IN LACTATED RINGERS INFUSION - SIMPLE MED: 62.5000 mL/h | INTRAVENOUS | Status: AC

## 2015-03-23 MED ORDER — OXYCODONE-ACETAMINOPHEN 5-325 MG PO TABS: 2.0000 | ORAL_TABLET | ORAL | Status: DC | PRN

## 2015-03-23 MED ORDER — FENTANYL CITRATE (PF) 100 MCG/2ML IJ SOLN
INTRAMUSCULAR | Status: DC | PRN
  Administered 2015-03-23: 10 ug via INTRATHECAL

## 2015-03-23 MED ORDER — TETANUS-DIPHTH-ACELL PERTUSSIS 5-2.5-18.5 LF-MCG/0.5 IM SUSP: 0.5000 mL | Freq: Once | INTRAMUSCULAR | Status: DC

## 2015-03-23 MED ORDER — DIPHENHYDRAMINE HCL 50 MG/ML IJ SOLN
12.5000 mg | INTRAMUSCULAR | Status: DC | PRN
Start: 2015-03-23 — End: 2015-03-23

## 2015-03-23 MED ORDER — KETOROLAC TROMETHAMINE 30 MG/ML IJ SOLN
INTRAMUSCULAR | Status: AC
Start: 2015-03-23 — End: 2015-03-24
  Filled 2015-03-23: qty 1

## 2015-03-23 MED ORDER — SODIUM CHLORIDE 0.9 % IJ SOLN
3.0000 mL | INTRAMUSCULAR | Status: DC | PRN
Start: 2015-03-23 — End: 2015-03-23

## 2015-03-23 MED ORDER — IBUPROFEN 600 MG PO TABS
600.0000 mg | ORAL_TABLET | Freq: Four times a day (QID) | ORAL | Status: DC
  Administered 2015-03-23 – 2015-03-25 (×7): 600 mg via ORAL
  Filled 2015-03-23 (×7): qty 1

## 2015-03-23 MED ORDER — OXYCODONE-ACETAMINOPHEN 5-325 MG PO TABS: 1.0000 | ORAL_TABLET | ORAL | Status: DC | PRN

## 2015-03-23 MED ORDER — LACTATED RINGERS IV SOLN: INTRAVENOUS | Status: DC

## 2015-03-23 MED ORDER — LANOLIN HYDROUS EX OINT
1.0000 | TOPICAL_OINTMENT | CUTANEOUS | Status: DC | PRN
Start: 2015-03-23 — End: 2015-03-25

## 2015-03-23 MED ORDER — ACETAMINOPHEN 500 MG PO TABS: 1000.0000 mg | ORAL_TABLET | Freq: Four times a day (QID) | ORAL | Status: DC

## 2015-03-23 MED ORDER — SIMETHICONE 80 MG PO CHEW
80.0000 mg | CHEWABLE_TABLET | ORAL | Status: DC
  Administered 2015-03-23 – 2015-03-24 (×2): 80 mg via ORAL
  Filled 2015-03-23 (×2): qty 1

## 2015-03-23 MED ORDER — KETOROLAC TROMETHAMINE 30 MG/ML IJ SOLN
30.0000 mg | Freq: Four times a day (QID) | INTRAMUSCULAR | Status: DC | PRN
Start: 2015-03-23 — End: 2015-03-23

## 2015-03-23 MED ORDER — WITCH HAZEL-GLYCERIN EX PADS: 1.0000 "application " | MEDICATED_PAD | CUTANEOUS | Status: DC | PRN

## 2015-03-23 MED ORDER — MORPHINE SULFATE (PF) 0.5 MG/ML IJ SOLN
INTRAMUSCULAR | Status: AC
  Filled 2015-03-23: qty 10

## 2015-03-23 MED ORDER — SENNOSIDES-DOCUSATE SODIUM 8.6-50 MG PO TABS
2.0000 | ORAL_TABLET | ORAL | Status: DC
  Administered 2015-03-23 – 2015-03-24 (×2): 2 via ORAL
  Filled 2015-03-23 (×2): qty 2

## 2015-03-23 MED ORDER — LACTATED RINGERS IV SOLN
Freq: Once | INTRAVENOUS | Status: AC
  Administered 2015-03-23: 10:00:00 via INTRAVENOUS

## 2015-03-23 MED ORDER — ACETAMINOPHEN 325 MG PO TABS
650.0000 mg | ORAL_TABLET | ORAL | Status: DC | PRN
  Administered 2015-03-24 – 2015-03-25 (×2): 650 mg via ORAL
  Filled 2015-03-23 (×2): qty 2

## 2015-03-23 MED ORDER — DIPHENHYDRAMINE HCL 25 MG PO CAPS
25.0000 mg | ORAL_CAPSULE | Freq: Four times a day (QID) | ORAL | Status: DC | PRN
  Administered 2015-03-24: 25 mg via ORAL
  Filled 2015-03-23: qty 1

## 2015-03-23 MED ORDER — ONDANSETRON HCL 4 MG/2ML IJ SOLN
INTRAMUSCULAR | Status: DC | PRN
  Administered 2015-03-23: 4 mg via INTRAVENOUS

## 2015-03-23 MED ORDER — NALOXONE HCL 0.4 MG/ML IJ SOLN: 0.4000 mg | INTRAMUSCULAR | Status: DC | PRN

## 2015-03-23 MED ORDER — MENTHOL 3 MG MT LOZG
1.0000 | LOZENGE | OROMUCOSAL | Status: DC | PRN
  Administered 2015-03-23: 3 mg via ORAL
  Filled 2015-03-23: qty 9

## 2015-03-23 MED ORDER — ONDANSETRON HCL 4 MG/2ML IJ SOLN
INTRAMUSCULAR | Status: AC
  Filled 2015-03-23: qty 2

## 2015-03-23 MED ORDER — CEFAZOLIN SODIUM-DEXTROSE 2-3 GM-% IV SOLR
INTRAVENOUS | Status: AC
  Filled 2015-03-23: qty 50

## 2015-03-23 MED ORDER — ONDANSETRON HCL 4 MG/2ML IJ SOLN: 4.0000 mg | Freq: Three times a day (TID) | INTRAMUSCULAR | Status: DC | PRN

## 2015-03-23 MED ORDER — KETOROLAC TROMETHAMINE 30 MG/ML IJ SOLN
30.0000 mg | Freq: Four times a day (QID) | INTRAMUSCULAR | Status: DC | PRN
Start: 2015-03-23 — End: 2015-03-23
  Administered 2015-03-23: 30 mg via INTRAMUSCULAR

## 2015-03-23 MED ORDER — BUPIVACAINE IN DEXTROSE 0.75-8.25 % IT SOLN
INTRATHECAL | Status: DC | PRN
  Administered 2015-03-23: 1.6 mg via INTRATHECAL

## 2015-03-23 MED ORDER — MORPHINE SULFATE (PF) 0.5 MG/ML IJ SOLN
INTRAMUSCULAR | Status: DC | PRN
  Administered 2015-03-23: .2 mg via INTRATHECAL

## 2015-03-23 MED ORDER — OXYTOCIN 10 UNIT/ML IJ SOLN
40.0000 [IU] | INTRAVENOUS | Status: DC | PRN
  Administered 2015-03-23: 40 [IU] via INTRAVENOUS

## 2015-03-23 MED ORDER — NALOXONE HCL 2 MG/2ML IJ SOSY
1.0000 ug/kg/h | PREFILLED_SYRINGE | INTRAVENOUS | Status: DC | PRN
  Filled 2015-03-23: qty 2

## 2015-03-23 MED ORDER — LACTATED RINGERS IV SOLN
INTRAVENOUS | Status: DC
  Administered 2015-03-23 (×3): via INTRAVENOUS

## 2015-03-23 MED ORDER — DIPHENHYDRAMINE HCL 25 MG PO CAPS
25.0000 mg | ORAL_CAPSULE | ORAL | Status: DC | PRN
  Filled 2015-03-23: qty 1

## 2015-03-23 MED ORDER — SIMETHICONE 80 MG PO CHEW
80.0000 mg | CHEWABLE_TABLET | ORAL | Status: DC | PRN
Start: 2015-03-23 — End: 2015-03-25

## 2015-03-23 MED ORDER — CEFAZOLIN SODIUM-DEXTROSE 2-3 GM-% IV SOLR
2.0000 g | INTRAVENOUS | Status: AC
  Administered 2015-03-23: 2 g via INTRAVENOUS

## 2015-03-23 MED ORDER — FENTANYL CITRATE (PF) 100 MCG/2ML IJ SOLN
INTRAMUSCULAR | Status: AC
  Filled 2015-03-23: qty 2

## 2015-03-23 MED ORDER — PHENYLEPHRINE 8 MG IN D5W 100 ML (0.08MG/ML) PREMIX OPTIME
INJECTION | INTRAVENOUS | Status: AC
  Filled 2015-03-23: qty 100

## 2015-03-23 MED ORDER — DIBUCAINE 1 % RE OINT: 1.0000 "application " | TOPICAL_OINTMENT | RECTAL | Status: DC | PRN

## 2015-03-23 MED ORDER — PHENYLEPHRINE 8 MG IN D5W 100 ML (0.08MG/ML) PREMIX OPTIME
INJECTION | INTRAVENOUS | Status: DC | PRN
  Administered 2015-03-23: 60 ug/min via INTRAVENOUS

## 2015-03-23 MED ORDER — SCOPOLAMINE 1 MG/3DAYS TD PT72: 1.0000 | MEDICATED_PATCH | Freq: Once | TRANSDERMAL | Status: DC

## 2015-03-23 SURGICAL SUPPLY — 39 items
APL SKNCLS STERI-STRIP NONHPOA (GAUZE/BANDAGES/DRESSINGS) ×1
BENZOIN TINCTURE PRP APPL 2/3 (GAUZE/BANDAGES/DRESSINGS) ×3 IMPLANT
CLAMP CORD UMBIL (MISCELLANEOUS) IMPLANT
CLOSURE WOUND 1/2 X4 (GAUZE/BANDAGES/DRESSINGS) ×1
CLOTH BEACON ORANGE TIMEOUT ST (SAFETY) ×3 IMPLANT
CONTAINER PREFILL 10% NBF 15ML (MISCELLANEOUS) IMPLANT
DRAPE SHEET LG 3/4 BI-LAMINATE (DRAPES) IMPLANT
DRSG OPSITE POSTOP 4X10 (GAUZE/BANDAGES/DRESSINGS) ×3 IMPLANT
DRSG OPSITE POSTOP 4X12 (GAUZE/BANDAGES/DRESSINGS) ×2 IMPLANT
DURAPREP 26ML APPLICATOR (WOUND CARE) ×3 IMPLANT
ELECT REM PT RETURN 9FT ADLT (ELECTROSURGICAL) ×3
ELECTRODE REM PT RTRN 9FT ADLT (ELECTROSURGICAL) ×1 IMPLANT
EXTRACTOR VACUUM M CUP 4 TUBE (SUCTIONS) IMPLANT
EXTRACTOR VACUUM M CUP 4' TUBE (SUCTIONS)
GLOVE BIO SURGEON STRL SZ7.5 (GLOVE) ×3 IMPLANT
GLOVE BIOGEL PI IND STRL 7.0 (GLOVE) ×1 IMPLANT
GLOVE BIOGEL PI IND STRL 7.5 (GLOVE) ×1 IMPLANT
GLOVE BIOGEL PI INDICATOR 7.0 (GLOVE) ×2
GLOVE BIOGEL PI INDICATOR 7.5 (GLOVE) ×2
GOWN STRL REUS W/TWL LRG LVL3 (GOWN DISPOSABLE) ×6 IMPLANT
KIT ABG SYR 3ML LUER SLIP (SYRINGE) IMPLANT
NDL HYPO 25X5/8 SAFETYGLIDE (NEEDLE) IMPLANT
NEEDLE HYPO 25X5/8 SAFETYGLIDE (NEEDLE) IMPLANT
NS IRRIG 1000ML POUR BTL (IV SOLUTION) ×3 IMPLANT
PACK C SECTION WH (CUSTOM PROCEDURE TRAY) ×3 IMPLANT
PAD OB MATERNITY 4.3X12.25 (PERSONAL CARE ITEMS) ×3 IMPLANT
PENCIL SMOKE EVAC W/HOLSTER (ELECTROSURGICAL) ×3 IMPLANT
RTRCTR C-SECT PINK 25CM LRG (MISCELLANEOUS) ×3 IMPLANT
SPONGE LAP 18X18 X RAY DECT (DISPOSABLE) ×2 IMPLANT
STRIP CLOSURE SKIN 1/2X4 (GAUZE/BANDAGES/DRESSINGS) ×2 IMPLANT
SUT CHROMIC 2 0 CT 1 (SUTURE) ×3 IMPLANT
SUT MNCRL AB 3-0 PS2 27 (SUTURE) ×3 IMPLANT
SUT PLAIN 0 NONE (SUTURE) IMPLANT
SUT PLAIN 2 0 XLH (SUTURE) ×3 IMPLANT
SUT VIC AB 0 CT1 36 (SUTURE) ×3 IMPLANT
SUT VIC AB 0 CTX 36 (SUTURE) ×9
SUT VIC AB 0 CTX36XBRD ANBCTRL (SUTURE) ×3 IMPLANT
TOWEL OR 17X24 6PK STRL BLUE (TOWEL DISPOSABLE) ×3 IMPLANT
TRAY FOLEY CATH SILVER 14FR (SET/KITS/TRAYS/PACK) ×3 IMPLANT

## 2015-03-23 NOTE — Lactation Note (Signed)
This note was copied from the chart of Madison Brooke BonitoDebra Llewellyn. Lactation Consultation Note Initial visit at 5 hours of age.  Mom reports offering a bottle of formula so mom could eat, but she doesn't plan to continue giving formula she wants to breast feed.  Mom unsure how much baby took but doesn't think she took much.  Mom is experinced with older child nursing for 4 months.   Baby awake in crib fussy.  Baby burped when handed to mom to assist with football hold.  Mom has easily expressed colostrum.  MOm will need more teaching later.  Mom first allowed baby several shallow latches to tip of nipple and denies pain.  LC assisted with allowing baby to open mouth wide to latch deeply.  Pillows used for support and encouraged FOB to help with positioning for latch.  Baby has strong sucking bursts, but is too sleepy to maintain feeding without stimulation.  [facility] LC resources given and discussed.  Encouraged to feed with early cues on demand.  Early newborn behavior discussed.   Mom to call for assist as needed.    Patient Name: Madison Perry UJWJX'BToday's Date: 03/23/2015 Reason for consult: Initial assessment   Maternal Data Has patient been taught Hand Expression?: Yes Does the patient have breastfeeding experience prior to this delivery?: Yes  Feeding Feeding Type: Breast Fed Length of feed:  (Several mintues observed baby sleepy)  LATCH Score/Interventions Latch: Repeated attempts needed to sustain latch, nipple held in mouth throughout feeding, stimulation needed to elicit sucking reflex. Intervention(s): Adjust position;Assist with latch;Breast massage;Breast compression  Audible Swallowing: None  Type of Nipple: Everted at rest and after stimulation  Comfort (Breast/Nipple): Soft / non-tender     Hold (Positioning): Assistance needed to correctly position infant at breast and maintain latch. Intervention(s): Breastfeeding basics reviewed;Support Pillows;Position options;Skin to  skin  LATCH Score: 6  Lactation Tools Discussed/Used Pump Review: Setup, frequency, and cleaning Initiated by:: JS Date initiated:: 03/23/15   Consult Status Consult Status: Follow-up Date: 03/24/15 Follow-up type: In-patient    Jannifer RodneyShoptaw, Jana Lynn 03/23/2015, 5:18 PM

## 2015-03-23 NOTE — Anesthesia Postprocedure Evaluation (Signed)
Anesthesia Post Note  Patient: Madison RaiderDebra L Sick  Procedure(s) Performed: Procedure(s) (LRB): CESAREAN SECTION (N/A)  Patient location during evaluation: PACU Anesthesia Type: Spinal Level of consciousness: awake and alert Pain management: pain level controlled Vital Signs Assessment: post-procedure vital signs reviewed and stable Respiratory status: spontaneous breathing, nonlabored ventilation, respiratory function stable and patient connected to nasal cannula oxygen Cardiovascular status: blood pressure returned to baseline and stable Postop Assessment: no signs of nausea or vomiting and spinal receding Anesthetic complications: no    Last Vitals:  Filed Vitals:   03/23/15 1246 03/23/15 1247  BP:    Pulse: 75 79  Temp:    Resp: 12 16    Last Pain: There were no vitals filed for this visit.               Amiaya Mcneeley JENNETTE

## 2015-03-23 NOTE — Op Note (Signed)
Cesarean Section Procedure Note  Indications: P2 at 5139 1/7wks for scheduled c-section  Pre-operative Diagnosis: Prior Cesarean Section   Post-operative Diagnosis: Prior Cesarean Section  Procedure: REPEAT CESAREAN SECTION  Surgeon: Osborn CohoAngela Tyffani Foglesong, MD    Assistants: Alphonzo Severanceachel Stall, CNM  Anesthesia: Regional   Procedure Details  The patient was taken to the operating room secondary to repeat c-section after the risks, benefits, complications, treatment options, and expected outcomes were discussed with the patient.  The patient concurred with the proposed plan, giving informed consent which was signed and witnessed. The patient was taken to Operating Room C-Section Suite, identified as Madison Perry and the procedure verified as C-Section Delivery. A Time Out was held and the above information confirmed.  After induction of anesthesia by obtaining a spinal, the patient was prepped and draped in the usual sterile manner. A Pfannenstiel skin incision was made and carried down through the subcutaneous tissue to the underlying layer of fascia.  The fascia was incised bilaterally and extended transversely bilaterally with the Mayo scissors. Kocher clamps were placed on the inferior aspect of the fascial incision and the underlying rectus muscle was separated from the fascia. The same was done on the superior aspect of the fascial incision.  The peritoneum was identified, entered bluntly and extended manually.  An Alexis self-retaining retractor was placed.  The utero-vesical peritoneal reflection was incised transversely and the bladder flap was bluntly freed from the lower uterine segment. A low transverse uterine incision was made with the scalpel and extended bilaterally with the bandage scissors.  The infant was delivered in vertex position without difficulty.  After the umbilical cord was clamped and cut, the infant was handed to the awaiting pediatricians.  Cord blood was obtained for evaluation.   The placenta was removed intact and appeared to be within normal limits. The uterus was cleared of all clots and debris. The uterine incision was closed with running interlocking sutures of 0 Vicryl and a second imbricating layer was performed as well.   Bilateral tubes and ovaries appeared to be within normal limits.  Good hemostasis was noted.  Copious irrigation was performed until clear.  The peritoneum was repaired with 2-0 chromic via a running suture.  The fascia was reapproximated with a running suture of 0 Vicryl. The subcutaneous tissue was reapproximated with 3 interrupted sutures of 2-0 plain.  The skin was reapproximated with a subcuticular suture of 3-0 monocryl.  Steristrips were applied.  Instrument, sponge, and needle counts were correct prior to abdominal closure and at the conclusion of the case.  The patient was awaiting transfer to the recovery room in good condition.  Findings: Live female infant with Apgars 9 at one minute and 9 at five minutes.  Normal appearing bilateral ovaries and fallopian tubes were noted.  Estimated Blood Loss:  600 ml         Drains: foley to gravity 300 cc         Total IV Fluids: 2500 ml         Specimens to Pathology: Placenta         Complications:  None; patient tolerated the procedure well.         Disposition: PACU - hemodynamically stable.         Condition: stable  Attending Attestation: I performed the procedure.

## 2015-03-23 NOTE — Addendum Note (Signed)
Addendum  created 03/23/15 1314 by Karie SchwalbeMary Kahron Kauth, MD   Modules edited: Anesthesia Blocks and Procedures, Anesthesia Medication Administration, Clinical Notes   Clinical Notes:  File: [id number]

## 2015-03-23 NOTE — Anesthesia Procedure Notes (Signed)
Spinal Patient location during procedure: OR Staffing Anesthesiologist: Kiarra Kidd Performed by: anesthesiologist  Preanesthetic Checklist Completed: patient identified, site marked, surgical consent, pre-op evaluation, timeout performed, IV checked, risks and benefits discussed and monitors and equipment checked Spinal Block Patient position: sitting Prep: DuraPrep Patient monitoring: continuous pulse ox, blood pressure and heart rate Approach: midline Location: L3-4 Injection technique: single-shot Needle Needle type: Sprotte  Needle gauge: 24 G Needle length: 9 cm Additional Notes Functioning IV was confirmed and monitors were applied. Sterile prep and drape, including hand hygiene, mask and sterile gloves were used. The patient was positioned and the spine was prepped. The skin was anesthetized with lidocaine.  Free flow of clear CSF was obtained prior to injecting local anesthetic into the CSF.  The spinal needle aspirated freely following injection.  The needle was carefully withdrawn.  The patient tolerated the procedure well. Consent was obtained prior to procedure with all questions answered and concerns addressed. Risks including but not limited to bleeding, infection, nerve damage, paralysis, failed block, inadequate analgesia, allergic reaction, high spinal, itching and headache were discussed and the patient wished to proceed.   Ojani Berenson, MD     

## 2015-03-23 NOTE — Transfer of Care (Signed)
Immediate Anesthesia Transfer of Care Note  Patient: Madison RaiderDebra L Digman  Procedure(s) Performed: Procedure(s): CESAREAN SECTION (N/A)  Patient Location: PACU  Anesthesia Type:Spinal  Level of Consciousness: awake  Airway & Oxygen Therapy: Patient Spontanous Breathing  Post-op Assessment: Report given to RN  Post vital signs: Reviewed and stable  Last Vitals:  Filed Vitals:   03/23/15 0936  BP: 139/82  Pulse: 95  Temp: 36.7 C    Complications: No apparent anesthesia complications

## 2015-03-23 NOTE — Anesthesia Preprocedure Evaluation (Signed)
Anesthesia Evaluation  Patient identified by MRN, date of birth, ID band Patient awake    Reviewed: Allergy & Precautions, NPO status , Patient's Chart, lab work & pertinent test results  History of Anesthesia Complications Negative for: history of anesthetic complications  Airway Mallampati: II  TM Distance: >3 FB Neck ROM: Full    Dental no notable dental hx. (+) Dental Advisory Given   Pulmonary former smoker,    Pulmonary exam normal breath sounds clear to auscultation       Cardiovascular negative cardio ROS Normal cardiovascular exam Rhythm:Regular Rate:Normal     Neuro/Psych  Headaches, negative psych ROS   GI/Hepatic negative GI ROS, Neg liver ROS,   Endo/Other  Morbid obesity  Renal/GU negative Renal ROS  negative genitourinary   Musculoskeletal negative musculoskeletal ROS (+)   Abdominal   Peds negative pediatric ROS (+)  Hematology negative hematology ROS (+)   Anesthesia Other Findings   Reproductive/Obstetrics (+) Pregnancy                             Anesthesia Physical Anesthesia Plan  ASA: III  Anesthesia Plan: Spinal   Post-op Pain Management:    Induction:   Airway Management Planned:   Additional Equipment:   Intra-op Plan:   Post-operative Plan:   Informed Consent: I have reviewed the patients History and Physical, chart, labs and discussed the procedure including the risks, benefits and alternatives for the proposed anesthesia with the patient or authorized representative who has indicated his/her understanding and acceptance.   Dental advisory given  Plan Discussed with:   Anesthesia Plan Comments:         Anesthesia Quick Evaluation

## 2015-03-23 NOTE — Interval H&P Note (Signed)
History and Physical Interval Note:  03/23/2015 10:55 AM  Madison Perry  has presented today for surgery, with the diagnosis of Prior Cesarean Section  The various methods of treatment have been discussed with the patient and family. After consideration of risks, benefits and other options for treatment, the patient has consented to  Procedure(s): REPEAT CESAREAN SECTION (N/A) as a surgical intervention .  The patient's history has been reviewed, patient examined, no change in status, stable for surgery.  I have reviewed the patient's chart and labs.  Questions were answered to the patient's satisfaction.     Purcell NailsOBERTS,Shanele Nissan Y

## 2015-03-24 ENCOUNTER — Encounter (HOSPITAL_COMMUNITY): Payer: Self-pay | Admitting: Obstetrics and Gynecology

## 2015-03-24 LAB — CBC
HCT: 29.1 % — ABNORMAL LOW (ref 36.0–46.0)
Hemoglobin: 9.7 g/dL — ABNORMAL LOW (ref 12.0–15.0)
MCH: 26.8 pg (ref 26.0–34.0)
MCHC: 33.3 g/dL (ref 30.0–36.0)
MCV: 80.4 fL (ref 78.0–100.0)
PLATELETS: 220 10*3/uL (ref 150–400)
RBC: 3.62 MIL/uL — ABNORMAL LOW (ref 3.87–5.11)
RDW: 13.7 % (ref 11.5–15.5)
WBC: 9.2 10*3/uL (ref 4.0–10.5)

## 2015-03-24 NOTE — Anesthesia Postprocedure Evaluation (Signed)
Anesthesia Post Note  Patient: Madison RaiderDebra L Manton  Procedure(s) Performed: Procedure(s) (LRB): CESAREAN SECTION (N/A)  Patient location during evaluation: Mother Baby Anesthesia Type: Spinal Level of consciousness: awake Pain management: satisfactory to patient Vital Signs Assessment: post-procedure vital signs reviewed and stable Respiratory status: spontaneous breathing Cardiovascular status: stable Postop Assessment: no headache and no backache Anesthetic complications: no    Last Vitals:  Filed Vitals:   03/24/15 0200 03/24/15 0510  BP: 96/67 117/67  Pulse: 92 77  Temp: 36.9 C 37.6 C  Resp: 18 18    Last Pain: There were no vitals filed for this visit.               Cephus ShellingBURGER,Kalii Chesmore

## 2015-03-24 NOTE — Progress Notes (Signed)
Madison RaiderDebra L Perry [id number]  Subjective: Postpartum Day 2: Repeat C/S  Patient up ad lib, reports no syncope or dizziness. Feeding:  Breast Contraceptive plan:  None -  "I don't use Birth control, I am not sexually active"   Objective: Temp:  [97.8 F (36.6 C)-99.6 F (37.6 C)] 99.6 F (37.6 C) (12/01 0510) Pulse Rate:  [63-95] 77 (12/01 0510) Resp:  [12-25] 18 (12/01 0510) BP: (82-149)/(43-139) 117/67 mmHg (12/01 0510) SpO2:  [94 %-100 %] 96 % (12/01 0510) Weight:  [111.585 kg (246 lb)] 111.585 kg (246 lb) (11/30 0936)  CBC Latest Ref Rng 03/24/2015 03/21/2015 07/12/2011  WBC 4.0 - 10.5 K/uL 9.2 9.8 19.1(H)  Hemoglobin 12.0 - 15.0 g/dL 4.0(J9.7(L) 10.6(L) 10.7(L)  Hematocrit 36.0 - 46.0 % 29.1(L) 33.3(L) 32.6(L)  Platelets 150 - 400 K/uL 220 250 241     Physical Exam:  General: alert and cooperative Lochia: appropriate Uterine Fundus: firm Abdomen:  + bowel sounds, NT Incision: Honeycomb dressing CDI DVT Evaluation: No evidence of DVT seen on physical exam. Negative Homan's sign.   Assessment/Plan: Status post cesarean delivery, day 2. Stable Continue current care. Contraception Discussed the possibility of  return of fertility by 4 weeks, even in Breastfeeding Mom's, advised to discussed BC should she choose to become sexually active    Madison SeveranceRachel Eri Mcevers MSN, CNM 03/24/2015, 8:36 AM

## 2015-03-24 NOTE — Addendum Note (Signed)
Addendum  created 03/24/15 0854 by Algis GreenhouseLinda A Roisin Mones, CRNA   Modules edited: Clinical Notes   Clinical Notes:  File: [id number]

## 2015-03-24 NOTE — Lactation Note (Signed)
This note was copied from the chart of Madison Perry. Lactation Consultation Note  Patient Name: Madison Perry'XToday's Date: 03/24/2015 Reason for consult: Follow-up assessment  Baby is 10428 hours old and has been to the breast and also supplemented with formula.  Per mom I think I'm going to breast feed  due to the baby being spitty.  LC changed a large wet diaper , and mom also changed a large [facility]  Baby awake , but only showing few sign of hunger. LC placed baby skin to skin in a modified cross cradle  -laid back position. Baby latched , opened mouth , few swallows noted and fed for 5 mins, released. LC switched  Position to football so it would be safe for baby due to mom sitting in the chair.  Football position seemed more comfortable for Baby and mom . ( is a 9-12.8 oz baby today) . LC discussed with  mom the football position can allow mom to have good firm support until the baby is consistent with latch , also allow mom  To relax back and shoulders, therefore enhance milk flow. Per mom comfortable. During 2nd latch baby seems more interested  With feeding , but needed stimulation, one burst of increased swallows , and then intermittent. LC encouraged mom to allow baby  to get hungry , hold off on formula , and work on the latching. [facility] or LC.  Also reviewed steps for latching - breast massage, hand express, pre-pump to prime the milk ducts , latch with breast compressions  Until consistent swallows, then intermittent.  [facility] RN Tana CoastBeth Earle aware of LC plan .     Maternal Data Has patient been taught Hand Expression?: Yes  Feeding Feeding Type: Breast Fed Nipple Type: Slow - flow Length of feed: 10 min (swallows noted , not acting overly hungry , still latched )  LATCH Score/Interventions Latch: Grasps breast easily, tongue down, lips flanged, rhythmical sucking. Intervention(s): Adjust position;Assist with latch;Breast  massage;Breast compression  Audible Swallowing: A few with stimulation  Type of Nipple: Everted at rest and after stimulation  Comfort (Breast/Nipple): Soft / non-tender     Hold (Positioning): Assistance needed to correctly position infant at breast and maintain latch. Intervention(s): Breastfeeding basics reviewed;Support Pillows;Position options;Skin to skin  LATCH Score: 8  Lactation Tools Discussed/Used Tools: Pump Breast pump type: Manual   Consult Status Consult Status: Follow-up (LC enc mom to allow baby to get hungry and hold off on formula ) Date: 03/25/15 Follow-up type: In-patient    Madison Perry, Madison Perry Ann 03/24/2015, 3:51 PM

## 2015-03-25 LAB — TYPE AND SCREEN
ABO/RH(D): A POS
ANTIBODY SCREEN: NEGATIVE
Unit division: 0
Unit division: 0

## 2015-03-25 MED ORDER — IBUPROFEN 600 MG PO TABS: 600.0000 mg | ORAL_TABLET | Freq: Four times a day (QID) | ORAL | Status: DC | PRN

## 2015-03-25 NOTE — Discharge Instructions (Signed)
Postpartum Care After Cesarean Delivery °After you deliver your newborn (postpartum period), the usual stay in the hospital is 24-72 hours. If there were problems with your labor or delivery, or if you have other medical problems, you might be in the hospital longer.  °While you are in the hospital, you will receive help and instructions on how to care for yourself and your newborn during the postpartum period.  °While you are in the hospital: °· It is normal for you to have pain or discomfort from the incision in your abdomen. Be sure to tell your nurses when you are having pain, where the pain is located, and what makes the pain worse. °· If you are breastfeeding, you may feel uncomfortable contractions of your uterus for a couple of weeks. This is normal. The contractions help your uterus get back to normal size. °· It is normal to have some bleeding after delivery. °· For the first 1-3 days after delivery, the flow is red and the amount may be similar to a period. °· It is common for the flow to start and stop. °· In the first few days, you may pass some small clots. Let your nurses know if you begin to pass large clots or your flow increases. °· Do not  flush blood clots down the toilet before having the nurse look at them. °· During the next 3-10 days after delivery, your flow should become more watery and pink or brown-tinged in color. °· Ten to fourteen days after delivery, your flow should be a small amount of yellowish-white discharge. °· The amount of your flow will decrease over the first few weeks after delivery. Your flow may stop in 6-8 weeks. Most women have had their flow stop by 12 weeks after delivery. °· You should change your sanitary pads frequently. °· Wash your hands thoroughly with soap and water for at least 20 seconds after changing pads, using the toilet, or before holding or feeding your newborn. °· Your intravenous (IV) tubing will be removed when you are drinking enough fluids. °· The  urine drainage tube (urinary catheter) that was inserted before delivery may be removed within 6-8 hours after delivery or when feeling returns to your legs. You should feel like you need to empty your bladder within the first 6-8 hours after the catheter has been removed. °· In case you become weak, lightheaded, or faint, call your nurse before you get out of bed for the first time and before you take a shower for the first time. °· Within the first few days after delivery, your breasts may begin to feel tender and full. This is called engorgement. Breast tenderness usually goes away within 48-72 hours after engorgement occurs. You may also notice milk leaking from your breasts. If you are not breastfeeding, do not stimulate your breasts. Breast stimulation can make your breasts produce more milk. °· Spending as much time as possible with your newborn is very important. During this time, you and your newborn can feel close and get to know each other. Having your newborn stay in your room (rooming in) will help to strengthen the bond with your newborn. It will give you time to get to know your newborn and become comfortable caring for your newborn. °· Your hormones change after delivery. Sometimes the hormone changes can temporarily cause you to feel sad or tearful. These feelings should not last more than a few days. If these feelings last longer than that, you should talk to your   caregiver. °· If desired, talk to your caregiver about methods of family planning or contraception. °· Talk to your caregiver about immunizations. Your caregiver may want you to have the following immunizations before leaving the hospital: °· Tetanus, diphtheria, and pertussis (Tdap) or tetanus and diphtheria (Td) immunization. It is very important that you and your family (including grandparents) or others caring for your newborn are up-to-date with the Tdap or Td immunizations. The Tdap or Td immunization can help protect your newborn  from getting ill. °· Rubella immunization. °· Varicella (chickenpox) immunization. °· Influenza immunization. You should receive this annual immunization if you did not receive the immunization during your pregnancy. °  °This information is not intended to replace advice given to you by your health care provider. Make sure you discuss any questions you have with your health care provider. °  °Document Released: 01/02/2012 Document Reviewed: 01/02/2012 °Elsevier Interactive Patient Education ©2016 [employer] Inc[employer] ° °Iron-Rich Diet °Iron is a mineral that helps your body to produce hemoglobin. Hemoglobin is a protein in your red blood cells that carries oxygen to your body's tissues. Eating too little iron may cause you to feel weak and tired, and it can increase your risk for infection. Eating enough iron is necessary for your body's metabolism, muscle function, and nervous system. °Iron is naturally found in many foods. It can also be added to foods or fortified in foods. There are two types of dietary iron: °· Heme iron. Heme iron is absorbed by the body more easily than nonheme iron. Heme iron is found in meat, poultry, and fish. °· Nonheme iron. Nonheme iron is found in dietary supplements, iron-fortified grains, beans, and vegetables. °You may need to follow an iron-rich diet if: °· You have been diagnosed with iron deficiency or iron-deficiency anemia. °· You have a condition that prevents you from absorbing dietary iron, such as: °¨ Infection in your intestines. °¨ Celiac disease. This involves long-lasting (chronic) inflammation of your intestines. °· You do not eat enough iron. °· You eat a diet that is high in foods that impair iron absorption. °· You have lost a lot of blood. °· You have heavy bleeding during your menstrual cycle. °· You are pregnant. °WHAT IS MY PLAN? °Your health care provider may help you to determine how much iron you need per day based on your condition. Generally, when a person  consumes sufficient amounts of iron in the diet, the following iron needs are met: °· Men. °¨ 14-18 years old: 11 mg per day. °¨ 19-50 years old: 8 mg per day. °· Women.   °¨ 14-18 years old: 15 mg per day. °¨ 19-50 years old: 18 mg per day. °¨ Over 50 years old: 8 mg per day. °¨ Pregnant women: 27 mg per day. °¨ Breastfeeding women: 9 mg per day. °WHAT DO I NEED TO KNOW ABOUT AN IRON-RICH DIET? °· Eat fresh fruits and vegetables that are high in vitamin C along with foods that are high in iron. This will help increase the amount of iron that your body absorbs from food, especially with foods containing nonheme iron. Foods that are high in vitamin C include oranges, peppers, tomatoes, and mango. °· Take iron supplements only as directed by your health care provider. Overdose of iron can be life-threatening. If you were prescribed iron supplements, take them with orange juice or a vitamin C supplement. °· Cook foods in pots and pans that are made from iron.   °· Eat nonheme iron-containing foods alongside foods that   are high in heme iron. This helps to improve your iron absorption.   °· Certain foods and drinks contain compounds that impair iron absorption. Avoid eating these foods in the same meal as iron-rich foods or with iron supplements. These include: °¨ Coffee, black tea, and red wine. °¨ Milk, dairy products, and foods that are high in calcium. °¨ Beans, soybeans, and peas. °¨ Whole grains. °· When eating foods that contain both nonheme iron and compounds that impair iron absorption, follow these tips to absorb iron better.   °¨ Soak beans overnight before cooking. °¨ Soak whole grains overnight and drain them before using. °¨ Ferment flours before baking, such as using yeast in bread dough. °WHAT FOODS CAN I EAT? °Grains  °Iron-fortified breakfast cereal. Iron-fortified whole-wheat bread. Enriched rice. Sprouted grains. °Vegetables  °Spinach. Potatoes with skin. Green peas. Broccoli. Red and green bell  peppers. Fermented vegetables. °Fruits  °Prunes. Raisins. Oranges. Strawberries. Mango. Grapefruit. °Meats and Other Protein Sources  °Beef liver. Oysters. Beef. Shrimp. Turkey. Chicken. Tuna. Sardines. Chickpeas. Nuts. Tofu. °Beverages  °Tomato juice. Fresh orange juice. Prune juice. Hibiscus tea. Fortified instant breakfast shakes. °Condiments  °Tahini. Fermented soy sauce.  °Sweets and Desserts  °Black-strap molasses.  °Other  °Wheat germ. °The items listed above may not be a complete list of recommended foods or beverages. Contact your dietitian for more options.  °WHAT FOODS ARE NOT RECOMMENDED? °Grains  °Whole grains. Bran cereal. Bran flour. Oats. °Vegetables  °Artichokes. Brussels sprouts. Kale. °Fruits  °Blueberries. Raspberries. Strawberries. Figs. °Meats and Other Protein Sources  °Soybeans. Products made from soy protein. °Dairy  °Milk. Cream. Cheese. Yogurt. Cottage cheese. °Beverages  °Coffee. Black tea. Red wine. °Sweets and Desserts  °Cocoa. Chocolate. Ice cream. °Other  °Basil. Oregano. Parsley. °The items listed above may not be a complete list of foods and beverages to avoid. Contact your dietitian for more information.  °  °This information is not intended to replace advice given to you by your health care provider. Make sure you discuss any questions you have with your health care provider. °  °Document Released: 11/21/2004 Document Revised: 04/30/2014 Document Reviewed: 11/04/2013 °Elsevier Interactive Patient Education ©2016 Elsevier Inc. ° °

## 2015-03-25 NOTE — Discharge Summary (Signed)
OB Discharge Summary     Patient Name: Madison Perry DOB: March 03, 1991 MRN: [id number]  Date of admission: 03/23/2015 Delivering MD: Everett Graff   Date of discharge: 03/25/2015  Admitting diagnosis: Prior Cesarean Section Intrauterine pregnancy: [redacted]w[redacted]d     Secondary diagnosis:  Principal Problem:   S/P repeat low transverse C-section Active Problems:   Previous cesarean section--FTP to 9 cm   BMI 40.0-44.9, adult ([place])   Rubella non-immune status, antepartum   LGA (large for gestational age) fetus--9+3 at 30 weeks.  Additional problems: None     Discharge diagnosis: Term Pregnancy Delivered, previous C/S, desired repeat                                                                                                Post partum procedures:Declined MMR  Augmentation: None  Complications: None  Hospital course:  Sceduled C/S   24 y.o. yo [id number] at [redacted]w[redacted]d was admitted to the hospital 03/23/2015 for scheduled cesarean section with the following indication:Elective Repeat.  Membrane Rupture Time/Date: 11:44 AM ,03/23/2015   Patient delivered a Viable infant.03/23/2015  Details of operation can be found in separate operative note.  Pateint had an uncomplicated postpartum course.  She is ambulating, tolerating a regular diet, passing flatus, and urinating well. Patient is discharged home in stable condition on 03/25/2015 11:30 AM          Physical exam  Filed Vitals:   03/24/15 0840 03/24/15 1200 03/24/15 1844 03/25/15 0615  BP: 119/62 121/60 142/81 125/68  Pulse: 90 87 79 78  Temp: 98 F (36.7 C) 98 F (36.7 C) 98.1 F (36.7 C) 98.2 F (36.8 C)  TempSrc:   Oral Oral  Resp: $Remo'18 20 19 19  'FBdEb$ Height:      Weight:      SpO2: 97% 99%     General: alert Lochia: appropriate Uterine Fundus: firm Incision: Dressing is clean, dry, and intact DVT Evaluation: No evidence of DVT seen on physical exam. Negative Homan's sign. Labs: CBC Latest Ref Rng 03/24/2015 03/21/2015  07/12/2011  WBC 4.0 - 10.5 K/uL 9.2 9.8 19.1(H)  Hemoglobin 12.0 - 15.0 g/dL 9.7(L) 10.6(L) 10.7(L)  Hematocrit 36.0 - 46.0 % 29.1(L) 33.3(L) 32.6(L)  Platelets 150 - 400 K/uL 220 250 241    CMP 06/18/2007  Glucose 107(H)  BUN 10  Creatinine 0.69  Sodium 142  Potassium 4.1  Chloride 106  CO2 30  Calcium 9.9    Discharge instruction: per After Visit Summary and "Baby and Me Booklet".  After visit meds:    Medication List    STOP taking these medications        OVER THE COUNTER MEDICATION      TAKE these medications        acetaminophen 500 MG tablet  Commonly known as:  TYLENOL  Take 1,000 mg by mouth every 6 (six) hours as needed for moderate pain.     BENADRYL PO  Take 2 tablets by mouth at bedtime as needed (sleep).     [employer] PRENATAL GUMMY PO  Take 2 tablets by mouth daily.  ibuprofen 600 MG tablet  Commonly known as:  ADVIL,MOTRIN  Take 1 tablet (600 mg total) by mouth every 6 (six) hours as needed.        Diet: routine diet  Activity: Advance as tolerated. Pelvic rest for 6 weeks.   Outpatient follow up:6 weeks Follow up Appt:No future appointments. Follow up Visit:No Follow-up on file.  Postpartum contraception: None--abstinence  Newborn Data: Live born female  Birth Weight: 10 lb 1.4 oz (4575 g) APGAR: 9, 9  Baby Feeding: Bottle and Breast Disposition:home with mother   03/25/2015 Donnel Saxon, CNM

## 2017-05-12 ENCOUNTER — Encounter (HOSPITAL_COMMUNITY): Payer: Self-pay | Admitting: *Deleted

## 2017-05-12 ENCOUNTER — Other Ambulatory Visit: Payer: Self-pay

## 2017-05-12 ENCOUNTER — Emergency Department (HOSPITAL_COMMUNITY): Payer: Self-pay

## 2017-05-12 ENCOUNTER — Emergency Department (HOSPITAL_COMMUNITY)
Admission: EM | Admit: 2017-05-12 | Discharge: 2017-05-12 | Disposition: A | Discharge status: Home / Self Care | Payer: Self-pay | Attending: Emergency Medicine | Admitting: Emergency Medicine

## 2017-05-12 ENCOUNTER — Encounter (HOSPITAL_COMMUNITY): Payer: Self-pay | Admitting: Emergency Medicine

## 2017-05-12 DIAGNOSIS — Z5321 Procedure and treatment not carried out due to patient leaving prior to being seen by health care provider: Secondary | ICD-10-CM | POA: Insufficient documentation

## 2017-05-12 DIAGNOSIS — R51 Headache: Secondary | ICD-10-CM | POA: Insufficient documentation

## 2017-05-12 DIAGNOSIS — R079 Chest pain, unspecified: Secondary | ICD-10-CM | POA: Insufficient documentation

## 2017-05-12 LAB — BASIC METABOLIC PANEL
ANION GAP: 6 (ref 5–15)
BUN: 16 mg/dL (ref 6–20)
CHLORIDE: 105 mmol/L (ref 101–111)
CO2: 26 mmol/L (ref 22–32)
Calcium: 9.2 mg/dL (ref 8.9–10.3)
Creatinine, Ser: 0.48 mg/dL (ref 0.44–1.00)
GFR calc Af Amer: >60 mL/min (ref >60)
Glucose, Bld: 103 mg/dL — ABNORMAL HIGH (ref 65–99)
Potassium: 4.2 mmol/L (ref 3.5–5.1)
SODIUM: 137 mmol/L (ref 135–145)

## 2017-05-12 LAB — I-STAT TROPONIN, ED: Troponin i, poc: 0 ng/mL (ref 0.00–0.08)

## 2017-05-12 LAB — CBC
HEMATOCRIT: 41.2 % (ref 36.0–46.0)
HEMOGLOBIN: 13.5 g/dL (ref 12.0–15.0)
MCH: 28.2 pg (ref 26.0–34.0)
MCHC: 32.8 g/dL (ref 30.0–36.0)
MCV: 86 fL (ref 78.0–100.0)
Platelets: 337 10*3/uL (ref 150–400)
RBC: 4.79 MIL/uL (ref 3.87–5.11)
RDW: 13.4 % (ref 11.5–15.5)
WBC: 13.9 10*3/uL — AB (ref 4.0–10.5)

## 2017-05-12 LAB — I-STAT BETA HCG BLOOD, ED (MC, WL, AP ONLY)

## 2017-05-12 NOTE — ED Notes (Signed)
Patient transported to X-ray 

## 2017-05-12 NOTE — ED Triage Notes (Signed)
Pt has been having posterior headaches and neck pain radiating into jaw for the past two weeks. Tonight started having chest pain after eating

## 2017-05-12 NOTE — ED Triage Notes (Signed)
Patient is complaining of left chest pain. Patient states she started having chest pain around one. Patient is complaining headache.

## 2017-05-12 NOTE — ED Notes (Signed)
Pt states she does not want to wait anymore.

## 2019-06-07 IMAGING — CR DG CHEST 2V
2 series · 2 of 2 positions shown · non-contrast
Comparison: Chest radiograph 02/14/2014

CLINICAL DATA: Chest pain

EXAM:
CHEST  2 VIEW

[w chest pa]
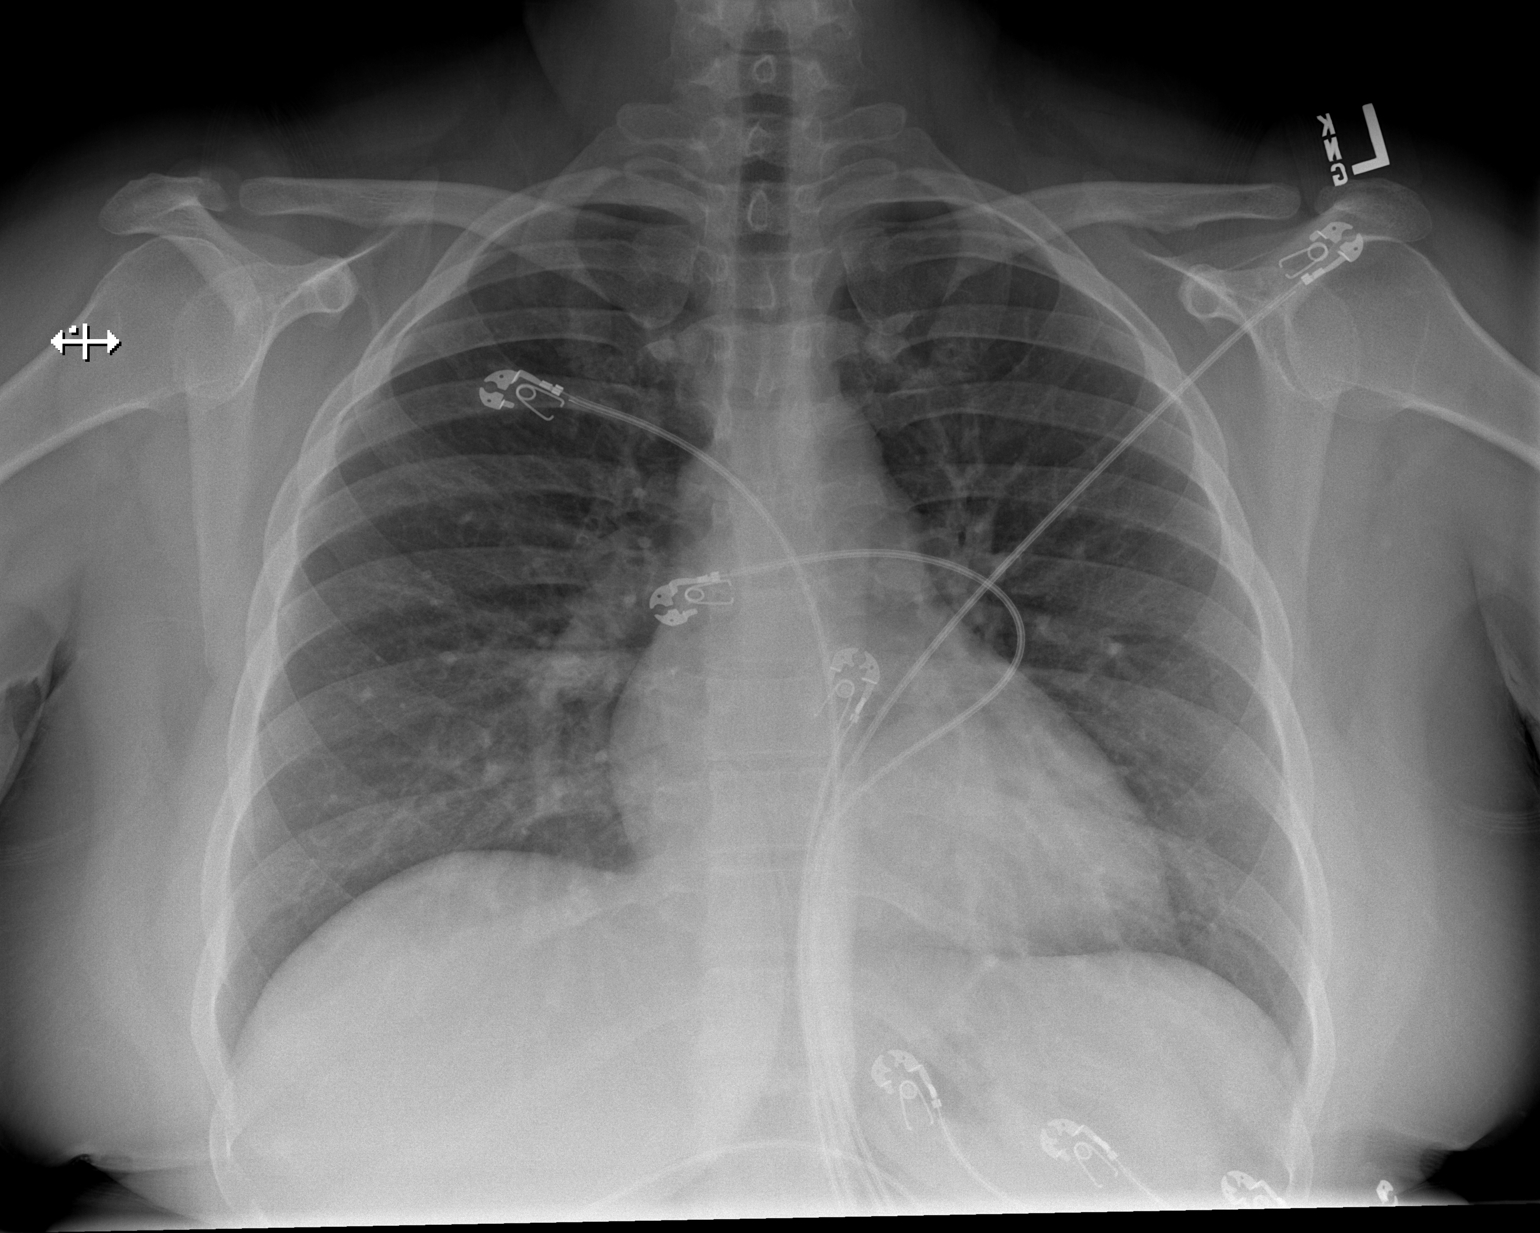

[w chest lat]
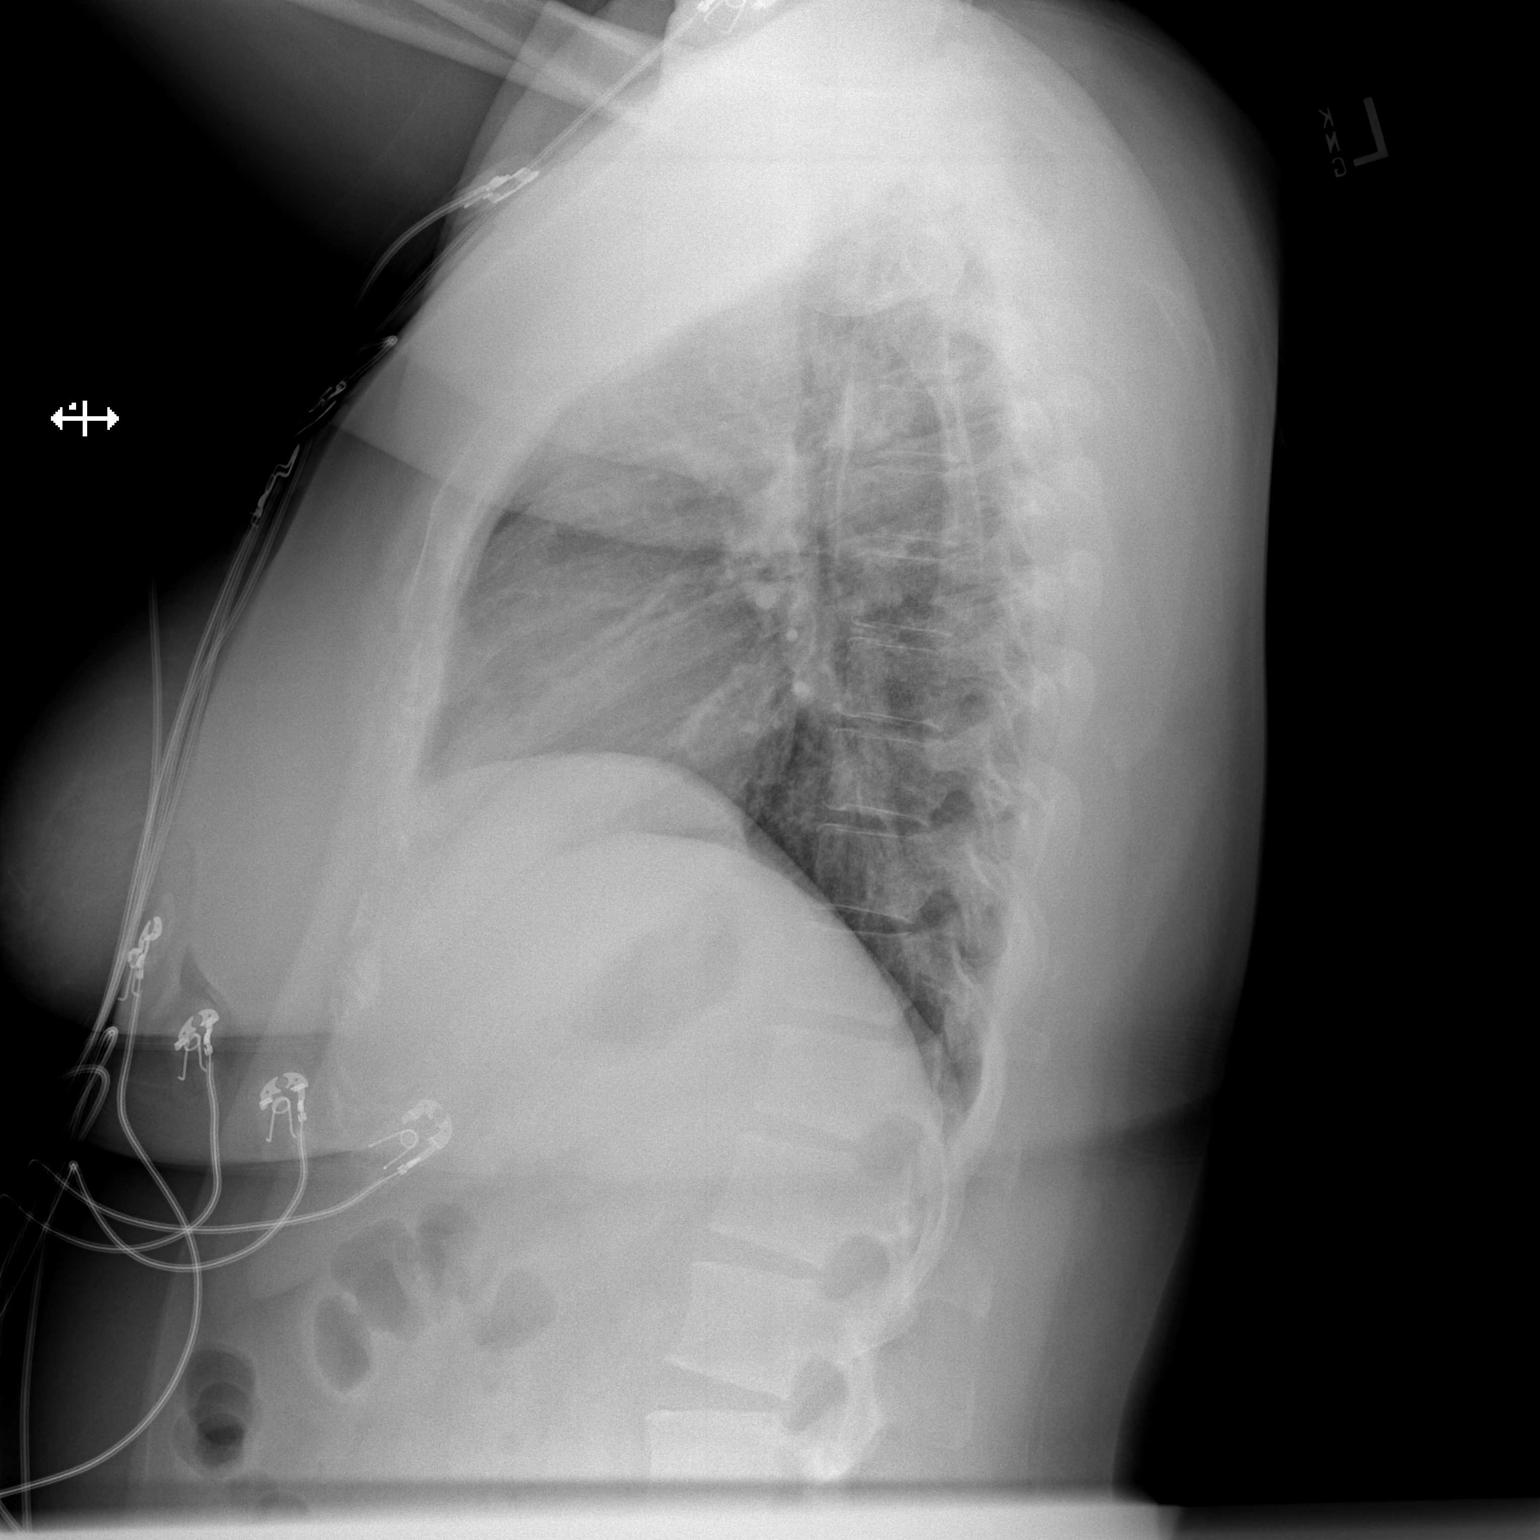

[2 of 2 positions shown; findings below may reference images not displayed]

FINDINGS: The heart size and mediastinal contours are within normal limits.
Both lungs are clear. The visualized skeletal structures are
unremarkable.
IMPRESSION: No active cardiopulmonary disease.

## 2021-01-17 DIAGNOSIS — N925 Other specified irregular menstruation: Secondary | ICD-10-CM | POA: Diagnosis not present

## 2021-01-17 LAB — OB RESULTS CONSOLE ABO/RH

## 2021-01-17 LAB — HEPATITIS C ANTIBODY: HCV Ab: NEGATIVE

## 2021-01-17 LAB — OB RESULTS CONSOLE RPR: RPR: NONREACTIVE

## 2021-02-14 DIAGNOSIS — Z3481 Encounter for supervision of other normal pregnancy, first trimester: Secondary | ICD-10-CM | POA: Diagnosis not present

## 2021-06-14 ENCOUNTER — Other Ambulatory Visit: Payer: Self-pay

## 2021-06-14 ENCOUNTER — Encounter: Payer: Self-pay | Admitting: Registered"

## 2021-06-14 ENCOUNTER — Other Ambulatory Visit: Payer: Self-pay | Admitting: Obstetrics and Gynecology

## 2021-06-14 ENCOUNTER — Encounter: Payer: Medicaid Other | Attending: Obstetrics and Gynecology | Admitting: Registered"

## 2021-06-14 DIAGNOSIS — O24419 Gestational diabetes mellitus in pregnancy, unspecified control: Secondary | ICD-10-CM | POA: Diagnosis not present

## 2021-06-14 NOTE — Progress Notes (Signed)
Patient was seen on 06/14/21 for Gestational Diabetes self-management class at the [facility]. The following learning objectives were met by the patient during this course:  States the definition of Gestational Diabetes States why dietary management is important in controlling blood glucose Describes the effects each nutrient has on blood glucose levels Demonstrates ability to create a balanced meal plan Demonstrates carbohydrate counting  States when to check blood glucose levels Demonstrates proper blood glucose monitoring techniques States the effect of stress and exercise on blood glucose levels States the importance of limiting caffeine and abstaining from alcohol and smoking  Blood glucose monitor given: Accu-chek Guide Me Lot #[id number] Exp: 023/19/2024 CBG: 119 mg/dL   Patient instructed to monitor glucose levels: FBS: 60 - <95; 1 hour: <140; 2 hour: <120  Patient received handouts: Nutrition Diabetes and Pregnancy, including carb counting list  Patient will be seen for follow-up as needed.

## 2021-07-06 LAB — OB RESULTS CONSOLE GBS: GBS: NEGATIVE

## 2021-07-17 ENCOUNTER — Encounter (HOSPITAL_COMMUNITY): Payer: Self-pay | Admitting: *Deleted

## 2021-07-17 NOTE — Patient Instructions (Signed)
Madison Perry ? 07/17/2021 ? ? Your procedure is scheduled on:  07/28/2021 ? Arrive at 0800 at [place] on [address] at [facility]  and [employer]. You are invited to use the FREE valet parking or use the Visitor's parking deck. ? Pick up the phone at the desk and dial [phone]. ? Call this number if you have problems the morning of surgery: [phone] ? Remember: ? ? Do not eat food:(After Midnight) Desp?s de medianoche. ? Do not drink clear liquids: (After Midnight) Desp?s de medianoche. ? Take these medicines the morning of surgery with A SIP OF WATER:  none ? ? Do not wear jewelry, make-up or nail polish. ? Do not wear lotions, powders, or perfumes. Do not wear deodorant. ? Do not shave 48 hours prior to surgery. ? Do not bring valuables to the hospital.  [facility] is not  ? responsible for any belongings or valuables brought to the hospital. ? Contacts, dentures or bridgework may not be worn into surgery. ? Leave suitcase in the car. After surgery it may be brought to your room. ? For patients admitted to the hospital, checkout time is 11:00 AM the day of  ?            discharge. ? ?   ? Please read over the following fact sheets that you were given:  ?   Preparing for Surgery ? ? ?

## 2021-07-26 ENCOUNTER — Encounter (HOSPITAL_COMMUNITY)
Admission: RE | Admit: 2021-07-26 | Discharge: 2021-07-26 | Disposition: A | Discharge status: Home / Self Care | Payer: Medicaid Other | Source: Ambulatory Visit | Attending: Family Medicine | Admitting: Family Medicine

## 2021-07-26 ENCOUNTER — Other Ambulatory Visit: Payer: Self-pay | Admitting: Obstetrics and Gynecology

## 2021-07-26 VITALS — Ht 63.0 in | Wt 247.0 lb

## 2021-07-26 DIAGNOSIS — Z01812 Encounter for preprocedural laboratory examination: Secondary | ICD-10-CM | POA: Insufficient documentation

## 2021-07-26 DIAGNOSIS — Z98891 History of uterine scar from previous surgery: Secondary | ICD-10-CM | POA: Diagnosis not present

## 2021-07-26 HISTORY — DX: Anxiety disorder, unspecified: F41.9

## 2021-07-26 HISTORY — DX: Gestational diabetes mellitus in pregnancy, unspecified control: O24.419

## 2021-07-26 LAB — TYPE AND SCREEN
ABO/RH(D): A POS
Antibody Screen: NEGATIVE

## 2021-07-26 LAB — CBC
HCT: 38.7 % (ref 36.0–46.0)
Hemoglobin: 12.5 g/dL (ref 12.0–15.0)
MCH: 27.6 pg (ref 26.0–34.0)
MCHC: 32.3 g/dL (ref 30.0–36.0)
MCV: 85.4 fL (ref 80.0–100.0)
Platelets: 256 10*3/uL (ref 150–400)
RBC: 4.53 MIL/uL (ref 3.87–5.11)
RDW: 14.4 % (ref 11.5–15.5)
WBC: 10.1 10*3/uL (ref 4.0–10.5)
nRBC: 0 % (ref 0.0–0.2)

## 2021-07-26 LAB — RPR: RPR Ser Ql: NONREACTIVE

## 2021-07-28 ENCOUNTER — Other Ambulatory Visit: Payer: Self-pay

## 2021-07-28 ENCOUNTER — Inpatient Hospital Stay (HOSPITAL_COMMUNITY): Payer: Medicaid Other | Admitting: Certified Registered Nurse Anesthetist

## 2021-07-28 ENCOUNTER — Encounter (HOSPITAL_COMMUNITY): Payer: Self-pay | Admitting: Obstetrics and Gynecology

## 2021-07-28 ENCOUNTER — Inpatient Hospital Stay (HOSPITAL_COMMUNITY)
Admission: RE | Admit: 2021-07-28 | Discharge: 2021-07-30 | DRG: 788 | Disposition: A | Discharge status: Home / Self Care | Payer: Medicaid Other | Attending: Obstetrics and Gynecology | Admitting: Obstetrics and Gynecology

## 2021-07-28 ENCOUNTER — Encounter (HOSPITAL_COMMUNITY)
Admission: RE | Disposition: A | Discharge status: Home / Self Care | Payer: Self-pay | Source: Home / Self Care | Attending: Obstetrics and Gynecology

## 2021-07-28 DIAGNOSIS — O2492 Unspecified diabetes mellitus in childbirth: Secondary | ICD-10-CM

## 2021-07-28 DIAGNOSIS — O99214 Obesity complicating childbirth: Secondary | ICD-10-CM | POA: Diagnosis present

## 2021-07-28 DIAGNOSIS — Z87891 Personal history of nicotine dependence: Secondary | ICD-10-CM

## 2021-07-28 DIAGNOSIS — Z98891 History of uterine scar from previous surgery: Principal | ICD-10-CM

## 2021-07-28 DIAGNOSIS — Z3A39 39 weeks gestation of pregnancy: Secondary | ICD-10-CM

## 2021-07-28 DIAGNOSIS — O9902 Anemia complicating childbirth: Secondary | ICD-10-CM | POA: Diagnosis present

## 2021-07-28 DIAGNOSIS — O24425 Gestational diabetes mellitus in childbirth, controlled by oral hypoglycemic drugs: Secondary | ICD-10-CM | POA: Diagnosis present

## 2021-07-28 DIAGNOSIS — Z3A37 37 weeks gestation of pregnancy: Secondary | ICD-10-CM

## 2021-07-28 DIAGNOSIS — O99344 Other mental disorders complicating childbirth: Secondary | ICD-10-CM | POA: Diagnosis not present

## 2021-07-28 DIAGNOSIS — O34211 Maternal care for low transverse scar from previous cesarean delivery: Principal | ICD-10-CM | POA: Diagnosis present

## 2021-07-28 DIAGNOSIS — O99824 Streptococcus B carrier state complicating childbirth: Secondary | ICD-10-CM | POA: Diagnosis present

## 2021-07-28 LAB — GLUCOSE, CAPILLARY
Glucose-Capillary: 86 mg/dL (ref 70–99)
Glucose-Capillary: 96 mg/dL (ref 70–99)

## 2021-07-28 SURGERY — Surgical Case
Anesthesia: Spinal

## 2021-07-28 MED ORDER — DIPHENHYDRAMINE HCL 50 MG/ML IJ SOLN: 12.5000 mg | INTRAMUSCULAR | Status: DC | PRN

## 2021-07-28 MED ORDER — STERILE WATER FOR IRRIGATION IR SOLN
Status: DC | PRN
  Administered 2021-07-28: 1000 mL

## 2021-07-28 MED ORDER — NALOXONE HCL 0.4 MG/ML IJ SOLN: 0.4000 mg | INTRAMUSCULAR | Status: DC | PRN

## 2021-07-28 MED ORDER — OXYTOCIN-SODIUM CHLORIDE 30-0.9 UT/500ML-% IV SOLN
INTRAVENOUS | Status: AC
  Filled 2021-07-28: qty 500

## 2021-07-28 MED ORDER — PRENATAL MULTIVITAMIN CH
1.0000 | ORAL_TABLET | Freq: Every day | ORAL | Status: DC
  Administered 2021-07-29: 1 via ORAL
  Filled 2021-07-28: qty 1

## 2021-07-28 MED ORDER — KETOROLAC TROMETHAMINE 30 MG/ML IJ SOLN
INTRAMUSCULAR | Status: AC
  Filled 2021-07-28: qty 1

## 2021-07-28 MED ORDER — CEFAZOLIN SODIUM-DEXTROSE 2-4 GM/100ML-% IV SOLN
2.0000 g | INTRAVENOUS | Status: AC
  Administered 2021-07-28: 2 g via INTRAVENOUS

## 2021-07-28 MED ORDER — MORPHINE SULFATE (PF) 0.5 MG/ML IJ SOLN
INTRAMUSCULAR | Status: DC | PRN
Start: 2021-07-28 — End: 2021-07-28
  Administered 2021-07-28: 150 ug via INTRATHECAL

## 2021-07-28 MED ORDER — NALOXONE HCL 4 MG/10ML IJ SOLN
1.0000 ug/kg/h | INTRAVENOUS | Status: DC | PRN
  Filled 2021-07-28: qty 5

## 2021-07-28 MED ORDER — COCONUT OIL OIL: 1.0000 "application " | TOPICAL_OIL | Status: DC | PRN

## 2021-07-28 MED ORDER — DIPHENHYDRAMINE HCL 25 MG PO CAPS: 25.0000 mg | ORAL_CAPSULE | ORAL | Status: DC | PRN

## 2021-07-28 MED ORDER — SIMETHICONE 80 MG PO CHEW: 80.0000 mg | CHEWABLE_TABLET | ORAL | Status: DC | PRN

## 2021-07-28 MED ORDER — PHENYLEPHRINE HCL-NACL 20-0.9 MG/250ML-% IV SOLN
INTRAVENOUS | Status: DC | PRN
  Administered 2021-07-28: 60 ug/min via INTRAVENOUS

## 2021-07-28 MED ORDER — SODIUM CHLORIDE 0.9 % IR SOLN
Status: DC | PRN
  Administered 2021-07-28: 1

## 2021-07-28 MED ORDER — FENTANYL CITRATE (PF) 100 MCG/2ML IJ SOLN
INTRAMUSCULAR | Status: DC | PRN
  Administered 2021-07-28: 15 ug via INTRATHECAL

## 2021-07-28 MED ORDER — DEXAMETHASONE SODIUM PHOSPHATE 10 MG/ML IJ SOLN
INTRAMUSCULAR | Status: DC | PRN
  Administered 2021-07-28: 10 mg via INTRAVENOUS

## 2021-07-28 MED ORDER — DIBUCAINE (PERIANAL) 1 % EX OINT: 1.0000 "application " | TOPICAL_OINTMENT | CUTANEOUS | Status: DC | PRN

## 2021-07-28 MED ORDER — ONDANSETRON HCL 4 MG/2ML IJ SOLN
INTRAMUSCULAR | Status: AC
  Filled 2021-07-28: qty 2

## 2021-07-28 MED ORDER — LACTATED RINGERS IV SOLN: INTRAVENOUS | Status: DC

## 2021-07-28 MED ORDER — DEXAMETHASONE SODIUM PHOSPHATE 10 MG/ML IJ SOLN
INTRAMUSCULAR | Status: AC
  Filled 2021-07-28: qty 1

## 2021-07-28 MED ORDER — PROMETHAZINE HCL 25 MG/ML IJ SOLN: 6.2500 mg | INTRAMUSCULAR | Status: DC | PRN

## 2021-07-28 MED ORDER — OXYTOCIN-SODIUM CHLORIDE 30-0.9 UT/500ML-% IV SOLN: 2.5000 [IU]/h | INTRAVENOUS | Status: AC

## 2021-07-28 MED ORDER — CEFAZOLIN SODIUM-DEXTROSE 2-4 GM/100ML-% IV SOLN
INTRAVENOUS | Status: AC
  Filled 2021-07-28: qty 100

## 2021-07-28 MED ORDER — OXYCODONE HCL 5 MG/5ML PO SOLN: 5.0000 mg | Freq: Once | ORAL | Status: DC | PRN

## 2021-07-28 MED ORDER — HYDROMORPHONE HCL 1 MG/ML IJ SOLN: 0.2500 mg | INTRAMUSCULAR | Status: DC | PRN

## 2021-07-28 MED ORDER — ONDANSETRON HCL 4 MG/2ML IJ SOLN
4.0000 mg | Freq: Four times a day (QID) | INTRAMUSCULAR | Status: DC
  Administered 2021-07-28: 4 mg via INTRAVENOUS
  Filled 2021-07-28: qty 2

## 2021-07-28 MED ORDER — ONDANSETRON HCL 4 MG/2ML IJ SOLN
INTRAMUSCULAR | Status: DC | PRN
  Administered 2021-07-28: 4 mg via INTRAVENOUS

## 2021-07-28 MED ORDER — SIMETHICONE 80 MG PO CHEW
80.0000 mg | CHEWABLE_TABLET | Freq: Three times a day (TID) | ORAL | Status: DC
  Administered 2021-07-29 (×3): 80 mg via ORAL
  Filled 2021-07-28 (×3): qty 1

## 2021-07-28 MED ORDER — OXYCODONE HCL 5 MG PO TABS: 5.0000 mg | ORAL_TABLET | Freq: Once | ORAL | Status: DC | PRN

## 2021-07-28 MED ORDER — SODIUM CHLORIDE 0.9% FLUSH: 3.0000 mL | INTRAVENOUS | Status: DC | PRN

## 2021-07-28 MED ORDER — DIPHENHYDRAMINE HCL 25 MG PO CAPS: 25.0000 mg | ORAL_CAPSULE | Freq: Four times a day (QID) | ORAL | Status: DC | PRN

## 2021-07-28 MED ORDER — FENTANYL CITRATE (PF) 100 MCG/2ML IJ SOLN
INTRAMUSCULAR | Status: AC
Start: 2021-07-28 — End: ?
  Filled 2021-07-28: qty 2

## 2021-07-28 MED ORDER — OXYTOCIN-SODIUM CHLORIDE 30-0.9 UT/500ML-% IV SOLN
INTRAVENOUS | Status: DC | PRN
  Administered 2021-07-28: 30 mL via INTRAVENOUS

## 2021-07-28 MED ORDER — POVIDONE-IODINE 10 % EX SWAB
2.0000 | Freq: Once | CUTANEOUS | Status: AC
Start: 2021-07-28 — End: 2021-07-28
  Administered 2021-07-28: 2 via TOPICAL

## 2021-07-28 MED ORDER — TETANUS-DIPHTH-ACELL PERTUSSIS 5-2.5-18.5 LF-MCG/0.5 IM SUSY: 0.5000 mL | PREFILLED_SYRINGE | Freq: Once | INTRAMUSCULAR | Status: DC

## 2021-07-28 MED ORDER — PHENYLEPHRINE HCL-NACL 20-0.9 MG/250ML-% IV SOLN
INTRAVENOUS | Status: AC
  Filled 2021-07-28: qty 250

## 2021-07-28 MED ORDER — WITCH HAZEL-GLYCERIN EX PADS: 1.0000 "application " | MEDICATED_PAD | CUTANEOUS | Status: DC | PRN

## 2021-07-28 MED ORDER — MORPHINE SULFATE (PF) 0.5 MG/ML IJ SOLN
INTRAMUSCULAR | Status: AC
  Filled 2021-07-28: qty 10

## 2021-07-28 MED ORDER — ZOLPIDEM TARTRATE 5 MG PO TABS: 5.0000 mg | ORAL_TABLET | Freq: Every evening | ORAL | Status: DC | PRN

## 2021-07-28 MED ORDER — KETOROLAC TROMETHAMINE 30 MG/ML IJ SOLN
30.0000 mg | Freq: Once | INTRAMUSCULAR | Status: AC | PRN
  Administered 2021-07-28: 30 mg via INTRAVENOUS

## 2021-07-28 MED ORDER — SENNOSIDES-DOCUSATE SODIUM 8.6-50 MG PO TABS
2.0000 | ORAL_TABLET | ORAL | Status: DC
  Administered 2021-07-29: 2 via ORAL
  Filled 2021-07-28: qty 2

## 2021-07-28 MED ORDER — BUPIVACAINE IN DEXTROSE 0.75-8.25 % IT SOLN
INTRATHECAL | Status: DC | PRN
  Administered 2021-07-28: 1.6 mL via INTRATHECAL

## 2021-07-28 MED ORDER — IBUPROFEN 100 MG/5ML PO SUSP
600.0000 mg | Freq: Four times a day (QID) | ORAL | Status: DC | PRN
  Administered 2021-07-29 (×4): 600 mg via ORAL
  Filled 2021-07-28 (×4): qty 30

## 2021-07-28 MED ORDER — MENTHOL 3 MG MT LOZG: 1.0000 | LOZENGE | OROMUCOSAL | Status: DC | PRN

## 2021-07-28 MED ORDER — OXYCODONE-ACETAMINOPHEN 5-325 MG PO TABS: 1.0000 | ORAL_TABLET | ORAL | Status: DC | PRN

## 2021-07-28 SURGICAL SUPPLY — 39 items
APL SKNCLS STERI-STRIP NONHPOA (GAUZE/BANDAGES/DRESSINGS) ×1
BENZOIN TINCTURE PRP APPL 2/3 (GAUZE/BANDAGES/DRESSINGS) ×3 IMPLANT
CHLORAPREP W/TINT 26ML (MISCELLANEOUS) ×3 IMPLANT
CLAMP CORD UMBIL (MISCELLANEOUS) IMPLANT
CLOSURE STERI STRIP 1/2 X4 (GAUZE/BANDAGES/DRESSINGS) ×1 IMPLANT
CLOTH BEACON ORANGE TIMEOUT ST (SAFETY) ×3 IMPLANT
DRAIN JACKSON PRT FLT 10 (DRAIN) IMPLANT
DRSG OPSITE POSTOP 4X10 (GAUZE/BANDAGES/DRESSINGS) ×3 IMPLANT
ELECT REM PT RETURN 9FT ADLT (ELECTROSURGICAL) ×2
ELECTRODE REM PT RTRN 9FT ADLT (ELECTROSURGICAL) ×2 IMPLANT
EVACUATOR SILICONE 100CC (DRAIN) IMPLANT
EXTRACTOR VACUUM M CUP 4 TUBE (SUCTIONS) IMPLANT
GLOVE BIO SURGEON STRL SZ 6.5 (GLOVE) ×3 IMPLANT
GLOVE BIOGEL PI IND STRL 7.0 (GLOVE) ×4 IMPLANT
GLOVE BIOGEL PI INDICATOR 7.0 (GLOVE) ×2
GOWN STRL REUS W/TWL LRG LVL3 (GOWN DISPOSABLE) ×6 IMPLANT
KIT ABG SYR 3ML LUER SLIP (SYRINGE) IMPLANT
NDL HYPO 25X5/8 SAFETYGLIDE (NEEDLE) IMPLANT
NEEDLE HYPO 25X5/8 SAFETYGLIDE (NEEDLE) IMPLANT
NS IRRIG 1000ML POUR BTL (IV SOLUTION) ×3 IMPLANT
PACK C SECTION WH (CUSTOM PROCEDURE TRAY) ×3 IMPLANT
PAD OB MATERNITY 4.3X12.25 (PERSONAL CARE ITEMS) ×3 IMPLANT
PENCIL SMOKE EVAC W/HOLSTER (ELECTROSURGICAL) ×3 IMPLANT
RTRCTR C-SECT PINK 25CM LRG (MISCELLANEOUS) IMPLANT
SPONGE T-LAP 18X18 ~~LOC~~+RFID (SPONGE) ×5 IMPLANT
STRIP CLOSURE SKIN 1/2X4 (GAUZE/BANDAGES/DRESSINGS) ×3 IMPLANT
SUT CHROMIC 0 CT 1 (SUTURE) ×3 IMPLANT
SUT MNCRL AB 3-0 PS2 27 (SUTURE) ×3 IMPLANT
SUT PLAIN 2 0 (SUTURE) ×4
SUT PLAIN 2 0 XLH (SUTURE) ×3 IMPLANT
SUT PLAIN ABS 2-0 CT1 27XMFL (SUTURE) ×4 IMPLANT
SUT SILK 2 0 SH (SUTURE) IMPLANT
SUT VIC AB 0 CTX 36 (SUTURE) ×10
SUT VIC AB 0 CTX36XBRD ANBCTRL (SUTURE) ×8 IMPLANT
SUT VIC AB 2-0 SH 27 (SUTURE)
SUT VIC AB 2-0 SH 27XBRD (SUTURE) IMPLANT
TOWEL OR 17X24 6PK STRL BLUE (TOWEL DISPOSABLE) ×3 IMPLANT
TRAY FOLEY W/BAG SLVR 14FR LF (SET/KITS/TRAYS/PACK) ×3 IMPLANT
WATER STERILE IRR 1000ML POUR (IV SOLUTION) ×3 IMPLANT

## 2021-07-28 NOTE — Anesthesia Preprocedure Evaluation (Signed)
Anesthesia Evaluation  ?Patient identified by MRN, date of birth, ID band ?Patient awake ? ? ? ?Reviewed: ?Allergy & Precautions, NPO status , Patient's Chart, lab work & pertinent test results ? ?History of Anesthesia Complications ?Negative for: history of anesthetic complications ? ?Airway ?Mallampati: II ? ?TM Distance: >3 FB ?Neck ROM: Full ? ? ? Dental ?no notable dental hx. ?(+) Dental Advisory Given ?  ?Pulmonary ?former smoker,  ?  ?Pulmonary exam normal ?breath sounds clear to auscultation ? ? ? ? ? ? Cardiovascular ?negative cardio ROS ?Normal cardiovascular exam ?Rhythm:Regular Rate:Normal ? ? ?  ?Neuro/Psych ? Headaches, Anxiety negative psych ROS  ? GI/Hepatic ?negative GI ROS, Neg liver ROS,   ?Endo/Other  ?diabetesMorbid obesity ? Renal/GU ?negative Renal ROS  ?negative genitourinary ?  ?Musculoskeletal ?negative musculoskeletal ROS ?(+)  ? Abdominal ?(+) + obese,   ?Peds ?negative pediatric ROS ?(+)  Hematology ?negative hematology ROS ?(+)   ?Anesthesia Other Findings ? ? Reproductive/Obstetrics ?(+) Pregnancy ? ?  ? ? ? ? ? ? ? ? ? ? ? ? ? ?  ?  ? ? ? ? ? ? ? ? ?Anesthesia Physical ? ?Anesthesia Plan ? ?ASA: III ? ?Anesthesia Plan: Spinal  ? ?Post-op Pain Management:   ? ?Induction:  ? ?PONV Risk Score and Plan:  ? ?Airway Management Planned:  ? ?Additional Equipment:  ? ?Intra-op Plan:  ? ?Post-operative Plan:  ? ?Informed Consent: I have reviewed the patients History and Physical, chart, labs and discussed the procedure including the risks, benefits and alternatives for the proposed anesthesia with the patient or authorized representative who has indicated his/her understanding and acceptance.  ? ? ? ?Dental advisory given ? ?Plan Discussed with:  ? ?Anesthesia Plan Comments:   ? ? ? ? ? ? ?Anesthesia Quick Evaluation ? ?

## 2021-07-28 NOTE — Anesthesia Procedure Notes (Signed)
Spinal ? ?Patient location during procedure: OB ?Start time: 07/28/2021 10:27 AM ?End time: 07/28/2021 10:32 AM ?Reason for block: surgical anesthesia ?Staffing ?Performed: anesthesiologist  ?Anesthesiologist: Lowella Curb, MD ?Preanesthetic Checklist ?Completed: patient identified, IV checked, risks and benefits discussed, surgical consent, monitors and equipment checked, pre-op evaluation and timeout performed ?Spinal Block ?Patient position: sitting ?Prep: DuraPrep and site prepped and draped ?Patient monitoring: heart rate, cardiac monitor, continuous pulse ox and blood pressure ?Approach: midline ?Location: L3-4 ?Injection technique: single-shot ?Needle ?Needle type: Pencan  ?Needle gauge: 24 G ?Needle length: 10 cm ?Assessment ?Sensory level: T4 ?Events: CSF return ?Additional Notes ?SAB placed by SRNA under direct supervision ? ? ? ?

## 2021-07-28 NOTE — Transfer of Care (Signed)
Immediate Anesthesia Transfer of Care Note ? ?Patient: Madison Perry ? ?Procedure(s) Performed: CESAREAN SECTION ? ?Patient Location: PACU ? ?Anesthesia Type:Spinal ? ?Level of Consciousness: awake, alert  and oriented ? ?Airway & Oxygen Therapy: Patient Spontanous Breathing ? ?Post-op Assessment: Report given to RN and Post -op Vital signs reviewed and stable ? ?Post vital signs: Reviewed and stable ? ?Last Vitals:  ?Vitals Value Taken Time  ?BP 116/73 07/28/21 1201  ?Temp    ?Pulse 79 07/28/21 1208  ?Resp 21 07/28/21 1208  ?SpO2 99 % 07/28/21 1208  ?Vitals shown include unvalidated device data. ? ?Last Pain:  ?Vitals:  ? 07/28/21 0841  ?TempSrc: Oral  ?   ? ?  ? ?Complications: No notable events documented. ?

## 2021-07-28 NOTE — Op Note (Signed)
Cesarean Section Procedure Note  ? ?Madison Perry ? ?07/28/2021 ? ?Indications: Scheduled Proceedure/Maternal Request  ? ?Pre-operative Diagnosis: POSTPROCEDURAL STATES.  ? ?Post-operative Diagnosis: Same  ? ?Surgeon: Juliann Mule) and Role: ?   * Crawford Givens, MD - Primary ?   Everett Graff, MD - Assisting  ? ?Assistants: Everett Graff MD  ? ?Anesthesia: spinal  ? ?Procedure Details:  ?The patient was seen in the Holding Room. The risks, benefits, complications, treatment options, and expected outcomes were discussed with the patient. The patient concurred with the proposed plan, giving informed consent. identified as Madison Perry and the procedure verified as C-Section Delivery. A Time Out was held and the above information confirmed.  ?After induction of anesthesia, the patient was draped and prepped in the usual sterile manner. A transverse incision was made and carried down through the subcutaneous tissue to the fascia. Fascial incision was made in the midline and extended transversely. The fascia was separated from the underlying rectus muscle superiorly and inferiorly. The peritoneum was identified and entered. Peritoneal incision was extended longitudinally with good visualization of bowel and bladder. The utero-vesical peritoneal reflection was incised transversely and the bladder flap was bluntly freed from the lower uterine segment.  An alexsis retractor was placed in the abdomen.   A low transverse uterine incision was made. Delivered from cephalic presentation was a  infant, with Apgar scores of 9 at one minute and 9 at five minutes. Cord ph was not sent the umbilical cord was clamped and cut cord blood was obtained for evaluation. The placenta was removed Intact and appeared normal. The uterine outline, tubes and ovaries appeared normal}. The uterine incision was closed with running locked sutures of 0Vicryl. A second layer 0 vicrlyl was used to imbricate the uterine incision    Hemostasis was  observed. Lavage was carried out until clear. The alexsis was removed.  The peritoneum was closed with 0 chromic.  The muscles were examined and any bleeders were made hemostatic using bovie cautery device.   The fascia was then reapproximated with running sutures of 0 vicryl.  The subcutaneous tissue was reapproximated  With interrupted stitches using 2-0 plain gut. The subcuticular closure was performed using 3-85monocryl    ? ?Instrument, sponge, and needle counts were correct prior the abdominal closure and were correct at the conclusion of the case.  ? ? ?Findings: infant was delivered from vtx presentation. The fluid was meconium stained  The uterus tubes and ovaries appeared normal.   ?  ?Estimated Blood Loss: 455ml  ? ?Total IV Fluids: 1026ml  ? ?Urine Output:  150CC OF clear urine ? ?Specimens: placenta to pathology ? ?Complications: no complications ? ?Disposition: PACU - hemodynamically stable.  ? ?Maternal Condition: stable  ? ?Baby condition / location:  Couplet care / Skin to Skin ? ?Attending Attestation: I was present and scrubbed for the entire procedure. My assitant was needed for the case  ? ?Signed: ?Surgeon(s): ?Crawford Givens, MD ?Everett Graff, MD ? ? ?  ?

## 2021-07-28 NOTE — H&P (Signed)
Madison Perry is a 31 y.o. female presenting for repeat CS at 39 weeks.  Pt desires to have a BTL however she did not sign the pap;ers. ?OB History   ? ? Gravida  ?3  ? Para  ?2  ? Term  ?2  ? Preterm  ?0  ? AB  ?0  ? Living  ?2  ?  ? ? SAB  ?0  ? IAB  ?0  ? Ectopic  ?0  ? Multiple  ?0  ? Live Births  ?2  ?   ?  ?  ? ?Past Medical History:  ?Diagnosis Date  ? Abnormal Pap smear   ? Anxiety   ? Eczema   ? Gestational diabetes   ? Headache   ? Migraines  ? No pertinent past medical history   ? Obese   ? Varicella   ? at age 67 or 84  ? Yeast infection of the vagina   ? frequent  ? ?Past Surgical History:  ?Procedure Laterality Date  ? CESAREAN SECTION  07/12/2011  ? Procedure: CESAREAN SECTION;  Surgeon: Hal Morales, MD;  Location: [facility];  Service: Gynecology;  Laterality: N/A;  Primary Cesarean Section Delivery Baby Girl @ [phone], Apgars 9/9  ? CESAREAN SECTION N/A 03/23/2015  ? Procedure: CESAREAN SECTION;  Surgeon: Osborn Coho, MD;  Location: [facility];  Service: Obstetrics;  Laterality: N/A;  ? WISDOM TOOTH EXTRACTION    ? ?Family History: family history includes Diabetes in her paternal grandmother; Heart disease in her paternal grandmother; Hypertension in her father and paternal grandmother. ?Social History:  reports that she quit smoking about 7 years ago. Her smoking use included cigarettes. She has a 0.70 pack-year smoking history. She has never used smokeless tobacco. She reports that she does not drink alcohol and does not use drugs. ? ? ?  ?Maternal Diabetes: Yes:  Diabetes Type:  Insulin/Medication controlled ?Genetic Screening: Normal ?Maternal Ultrasounds/Referrals: Normal ?Fetal Ultrasounds or other Referrals:  None ?Maternal Substance Abuse:  No ?Significant Maternal Medications:  Meds include: Other:  ?Significant Maternal Lab Results:  Group B Strep positive ?Other Comments:  None ? ?Review of Systems ?History ?  ?Blood pressure 131/86, pulse 80, temperature 97.7 ?F (36.5 ?C), temperature  source Oral, resp. rate 20, height 5\' 3"  (1.6 m), weight 113.9 kg, SpO2 100 %, currently breastfeeding. ?Exam ?Physical Exam  ?Physical Examination: General appearance - alert, well appearing, and in no distress ?Chest - clear to auscultation, no wheezes, rales or rhonchi, symmetric air entry ?Heart - normal rate and regular rhythm ?Abdomen - soft, nontender, nondistended, no masses or organomegaly ?Extremities - peripheral pulses normal, no pedal edema, no clubbing or cyanosis, Homan's sign negative bilaterally  ?Prenatal labs: ?ABO, Rh: --/--/A POS (04/05 09-11-1993) ?Antibody: NEG (04/05 0946) ?Rubella: Nonimmune (09/27 0000) ?RPR: NON REACTIVE (04/05 0947)  ?HBsAg: Negative (09/27 0000)  ?HIV: Non-reactive (09/27 0000)  ?GBS: Negative/-- (03/16 0000)  ? ?  Latest Ref Rng & Units 07/26/2021  ?  9:47 AM 05/12/2017  ?  4:20 AM 03/24/2015  ?  6:07 AM  ?CBC  ?WBC 4.0 - 10.5 K/uL 10.1   13.9   9.2    ?Hemoglobin 12.0 - 15.0 g/dL 14/04/2014   42.5   9.7    ?Hematocrit 36.0 - 46.0 % 38.7   41.2   29.1    ?Platelets 150 - 400 K/uL 256   337   220    ?  ? ?Assessment/Plan: ?Pt for repeat CS  at term ?GBS bacturia in the beginning of pregnancy.  Mother was treated.  Last swab was neg ?GDM on metformin.  BS stable this morning ?R&B reviewed ?Desires interval salpingectomy.  She did not sign the tubal papers  ? ?Galit Urich A Izadora Roehr ?07/28/2021, 9:54 AM ? ? ? ? ?

## 2021-07-28 NOTE — Lactation Note (Signed)
This note was copied from a baby's chart. ?Lactation Consultation Note ?I assisted with positioning and observed infant latch with short but frequent suckling bursts. He was on and off breast but appeared effective during suckling bursts. Mom endorsed strong tug without pinching/pain.  ? ?I reviewed feeding norms in 1st 24 hours. I encouraged lots of sts and feeding with cues today. I discouraged pacifier use.  ?RN present during visit and reported that blood sugar was checked recently. Results were not available during my visit.  ? ?Parents asked lots of questions. All questions and concerns were addressed. Parents are aware of LC support during [facility] stay. ? ?Patient Name: Madison Perry ?Today's Date: 07/28/2021 ?Reason for consult: Initial assessment;Maternal endocrine disorder ?Age:30 hours ? ?Maternal Data ?Has patient been taught Hand Expression?: Yes ?Does the patient have breastfeeding experience prior to this delivery?: Yes ?How long did the patient breastfeed?: 1-2 months ? ?Feeding ?Mother's Current Feeding Choice: Breast Milk ? ?LATCH Score ?Latch: Grasps breast easily, tongue down, lips flanged, rhythmical sucking. ? ?Audible Swallowing: A few with stimulation ? ?Type of Nipple: Everted at rest and after stimulation ? ?Comfort (Breast/Nipple): Soft / non-tender ? ?Hold (Positioning): Assistance needed to correctly position infant at breast and maintain latch. ? ?LATCH Score: 8 ? ? ?Lactation Tools Discussed/Used ? RN provided a hand pump at mom's request ? ?Interventions ?Interventions: Breast feeding basics reviewed;Support pillows;Assisted with latch;Skin to skin;Position options;Education ? ?Discharge ?Pump: Manual ?WIC Program: Yes ? ?Consult Status ?Consult Status: Follow-up ?Follow-up type: In-patient ? ? ? ?Elder Negus ?07/28/2021, 5:43 PM ? ? ? ?

## 2021-07-28 NOTE — Anesthesia Postprocedure Evaluation (Signed)
Anesthesia Post Note ? ?Patient: Madison Perry ? ?Procedure(s) Performed: CESAREAN SECTION ? ?  ? ?Patient location during evaluation: PACU ?Anesthesia Type: Spinal ?Level of consciousness: awake and alert ?Pain management: pain level controlled ?Vital Signs Assessment: post-procedure vital signs reviewed and stable ?Respiratory status: spontaneous breathing, nonlabored ventilation and respiratory function stable ?Cardiovascular status: blood pressure returned to baseline and stable ?Postop Assessment: no apparent nausea or vomiting ?Anesthetic complications: no ? ? ?No notable events documented. ? ?Last Vitals:  ?Vitals:  ? 07/28/21 1230 07/28/21 1245  ?BP: 110/78 121/88  ?Pulse: 86 75  ?Resp: (!) 22 20  ?Temp: 37.2 ?C   ?SpO2: 98% 98%  ?  ?Last Pain:  ?Vitals:  ? 07/28/21 1245  ?TempSrc:   ?PainSc: 1   ? ?Pain Goal: Patients Stated Pain Goal: 3 (07/28/21 1200) ? ?  ?  ?  ?  ?  ?  ?  ? ?Lynda Rainwater ? ? ? ? ?

## 2021-07-29 ENCOUNTER — Encounter (HOSPITAL_COMMUNITY): Payer: Self-pay | Admitting: Obstetrics and Gynecology

## 2021-07-29 LAB — CBC
HCT: 31.3 % — ABNORMAL LOW (ref 36.0–46.0)
Hemoglobin: 10.3 g/dL — ABNORMAL LOW (ref 12.0–15.0)
MCH: 27.9 pg (ref 26.0–34.0)
MCHC: 32.9 g/dL (ref 30.0–36.0)
MCV: 84.8 fL (ref 80.0–100.0)
Platelets: 233 10*3/uL (ref 150–400)
RBC: 3.69 MIL/uL — ABNORMAL LOW (ref 3.87–5.11)
RDW: 14.5 % (ref 11.5–15.5)
WBC: 12.9 10*3/uL — ABNORMAL HIGH (ref 4.0–10.5)
nRBC: 0 % (ref 0.0–0.2)

## 2021-07-29 LAB — GLUCOSE, CAPILLARY: Glucose-Capillary: 81 mg/dL (ref 70–99)

## 2021-07-29 LAB — GLUCOSE, RANDOM: Glucose, Bld: 88 mg/dL (ref 70–99)

## 2021-07-29 MED ORDER — ACETAMINOPHEN 500 MG PO TABS
1000.0000 mg | ORAL_TABLET | Freq: Four times a day (QID) | ORAL | Status: DC | PRN
  Administered 2021-07-29 – 2021-07-30 (×2): 1000 mg via ORAL
  Filled 2021-07-29 (×2): qty 2

## 2021-07-29 NOTE — Progress Notes (Signed)
Madison Perry [id number] ?Postpartum Postoperative Day # 1  ?Madison Perry, [phone], [redacted]w[redacted]d, S/P RCS LT Cesarean Section due to Elective. Pt was admttied for repeat LTCS with DR Dillard on 4/7, used spinal epidural for anesthesia, ebl was , hgb drop of 1.35-12.5, stable. Had baby female, will will be done 4/7. Pt is breast feeding going well. Desires interval BTL. Pt has GDmA2 was on metformin during pregnancy, but no meds during labor or PP, FBS was 81, and CBG WNL during her stay, will f/u in 6 weeks for 2HGTT to check for overt DM.   ? ?Patient Active Problem List  ? Diagnosis Date Noted  ? Normal postpartum course 07/29/2021  ? S/P C-section 07/28/2021  ? Gestational diabetes mellitus (GDM), antepartum 06/14/2021  ? S/P repeat low transverse C-section 03/23/2015  ? Previous cesarean section--FTP to 9 cm 03/20/2015  ? BMI 40.0-44.9, adult (HCC) 03/20/2015  ? Rubella non-immune status, antepartum 03/20/2015  ? LGA (large for gestational age) fetus--9+3 at 11 weeks. 03/20/2015  ?  ? ?Active Ambulatory Problems  ?  Diagnosis Date Noted  ? Previous cesarean section--FTP to 9 cm 03/20/2015  ? BMI 40.0-44.9, adult (HCC) 03/20/2015  ? Rubella non-immune status, antepartum 03/20/2015  ? LGA (large for gestational age) fetus--9+3 at 55 weeks. 03/20/2015  ? S/P repeat low transverse C-section 03/23/2015  ? Gestational diabetes mellitus (GDM), antepartum 06/14/2021  ? ?Resolved Ambulatory Problems  ?  Diagnosis Date Noted  ? No Resolved Ambulatory Problems  ? ?Past Medical History:  ?Diagnosis Date  ? Abnormal Pap smear   ? Anxiety   ? Eczema   ? Gestational diabetes   ? Headache   ? No pertinent past medical history   ? Obese   ? Varicella   ? Yeast infection of the vagina   ?  ? ?Subjective: ?Patient up ad lib, denies syncope or dizziness. Reports consuming regular diet without issues and denies N/V. Patient reports 0 bowel movement + passing flatus.  Denies issues with urination and reports bleeding is "lighter."   Patient is breastfeeding and reports going well.  Desires BTL out pt for postpartum contraception.  Pain is being appropriately managed with use of po meds.  ? ? ?Objective: ?Patient Vitals for the past 24 hrs: ? BP Temp Temp src Pulse Resp SpO2  ?07/29/21 0240 -- 98.5 ?F (36.9 ?C) Oral -- 18 --  ?07/28/21 2140 132/67 98 ?F (36.7 ?C) Oral 69 17 97 %  ?07/28/21 1720 111/73 97.7 ?F (36.5 ?C) Oral 67 17 97 %  ?07/28/21 1445 117/67 98.2 ?F (36.8 ?C) Oral 68 17 96 %  ?07/28/21 1330 118/77 98.6 ?F (37 ?C) Oral 77 18 96 %  ?  ? ?Physical Exam:  ?General: alert, cooperative, and appears stated age ?Mood/Affect: happy ?Lungs: clear to auscultation, no wheezes, rales or rhonchi, symmetric air entry.  ?Heart: normal rate, regular rhythm, normal S1, S2, no murmurs, rubs, clicks or gallops. ?Breast: breasts appear normal, no suspicious masses, no skin or nipple changes or axillary nodes. ?Abdomen:  + bowel sounds, soft, non-tender ?Incision: healing well, no significant drainage, no dehiscence, no significant erythema, Honeycomb dressing  ?Uterine Fundus: firm, involution -1 ?Lochia: appropriate ?Skin: Warm, Dry. ?DVT Evaluation: No evidence of DVT seen on physical exam. ?Negative Homan's sign. ?No cords or calf tenderness. ?No significant calf/ankle edema. ? ?Labs: ?Recent Labs  ?  07/29/21 ?0441  ?HGB 10.3*  ?HCT 31.3*  ?WBC 12.9*  ? ? ?CBG (last 3)  ?Recent Labs  ?  07/28/21 ?2878 07/28/21 ?1207 07/29/21 ?6767  ?GLUCAP 86 96 81  ?  ? ?I/O: ?I/O last 3 completed shifts: ?In: 1000 [I.V.:1000] ?Out: 2350 [Urine:1950; Blood:400]  ? ?Assessment ?Postpartum Postoperative Day # 1  ?Madison Perry, [phone], [redacted]w[redacted]d, S/P RCS LT Cesarean Section due to Elective. Pt was admttied for repeat LTCS with DR Dillard on 4/7, used spinal epidural for anesthesia, ebl was , hgb drop of 1.35-12.5, stable. Had baby female, will will be done 4/7. Pt is breast feeding going well. Desires interval BTL. Pt has GDmA2 was on metformin during  pregnancy, but no meds during labor or PP, FBS was 81, and CBG WNL during her stay, will f/u in 6 weeks for 2HGTT to check for overt DM.  Pt stable. -1 [employer]. breastFeeding. ?Hemodynamically Stable. ? ?Plan: ?Continue other mgmt as ordered ?VTE Prophylactics: SCD, ambulated as tolerates.  ?Pain control: Motrin/Tylenol/Narcotics PRN ?Education given regarding options for contraception, including barrier methods, injectable contraception, IUD placement, oral contraceptives.  ?Plan for discharge tomorrow ? ?Dr. Normand Sloop to be updated on patient status ? ?[facility] NP-C, CNM ?07/29/2021, 1:22 PM ?  ?

## 2021-07-30 MED ORDER — PRENATAL MULTIVITAMIN CH: 1.0000 | ORAL_TABLET | Freq: Every day | ORAL | 1 refills | Status: AC

## 2021-07-30 MED ORDER — IBUPROFEN 100 MG/5ML PO SUSP
600.0000 mg | Freq: Four times a day (QID) | ORAL | 0 refills | Status: AC | PRN
Start: 2021-07-30 — End: ?

## 2021-07-30 MED ORDER — SENNOSIDES-DOCUSATE SODIUM 8.6-50 MG PO TABS: 2.0000 | ORAL_TABLET | ORAL | 1 refills | Status: AC

## 2021-07-30 NOTE — Discharge Summary (Signed)
? ?  Postpartum Discharge Summary ? ?Date of Service updated4/9/23 ? ?   ?Patient Name: Madison Perry ?DOB: 10/07/1990 ?MRN: [id number] ? ?Date of admission: 07/28/2021 ?Delivery date:07/28/2021  ?Delivering provider: Crawford Givens  ?Date of discharge: 07/30/2021 ? ?Admitting diagnosis: S/P C-section [B35.329] ?Intrauterine pregnancy: [redacted]w[redacted]d     ?Secondary diagnosis:  Principal Problem: ?  S/P C-section ?Active Problems: ?  Normal postpartum course ? ?Additional problems: GDMA2    ?Discharge diagnosis: Term Pregnancy Delivered, GDM A2, and Anemia                                              ?Post partum procedures:   ?Augmentation: N/A ?Complications: None ? ?Hospital course: Sceduled C/S   31 y.o. yo G3P3003 at [redacted]w[redacted]d was admitted to the hospital 07/28/2021 for scheduled cesarean section with the following indication:Prior Uterine Surgery.Delivery details are as follows:  ?Membrane Rupture Time/Date: 11:05 AM ,07/28/2021   ?Delivery Method:C-Section, Low Transverse  ?Details of operation can be found in separate operative note.  Patient had an uncomplicated postpartum course.  She is ambulating, tolerating a regular diet, passing flatus, and urinating well. Patient is discharged home in stable condition on  07/30/21 ?       ?Newborn Data: ?Birth date:07/28/2021  ?Birth time:11:06 AM  ?Gender:Female  ?Living status:Living  ?Apgars:9 ,9  ?Weight:4190 g    ? ?Magnesium Sulfate received: No ?BMZ received: No ?Rhophylac:No ?MMR:Yes ?T-DaP:Given prenatally ?Flu: Yes ?Transfusion:No ? ?Physical exam  ?Vitals:  ? 07/29/21 0240 07/29/21 1717 07/29/21 2105 07/30/21 0654  ?BP:  115/69 115/71 117/73  ?Pulse:  85 75 78  ?Resp: $Remov'18  18 18  'tejHzw$ ?Temp: 98.5 ?F (36.9 ?C) 97.6 ?F (36.4 ?C) 98.6 ?F (37 ?C) 98.7 ?F (37.1 ?C)  ?TempSrc: Oral Oral Oral Oral  ?SpO2:  98% 97% 98%  ?Weight:      ?Height:      ? ?General: alert and cooperative ?Lochia: appropriate ?Uterine Fundus: firm ?Incision: Healing well with no significant drainage, Dressing is clean,  dry, and intact ?DVT Evaluation: No evidence of DVT seen on physical exam. ?Labs: ?Lab Results  ?Component Value Date  ? WBC 12.9 (H) 07/29/2021  ? HGB 10.3 (L) 07/29/2021  ? HCT 31.3 (L) 07/29/2021  ? MCV 84.8 07/29/2021  ? PLT 233 07/29/2021  ? ? ?  Latest Ref Rng & Units 07/29/2021  ?  4:41 AM  ?CMP  ?Glucose 70 - 99 mg/dL 88    ? ?Edinburgh Score: ? ?  07/28/2021  ?  6:00 PM  ?Edinburgh Postnatal Depression Scale Screening Tool  ?I have been able to laugh and see the funny side of things. 0  ?I have looked forward with enjoyment to things. 0  ?I have blamed myself unnecessarily when things went wrong. 1  ?I have been anxious or worried for no good reason. 0  ?I have felt scared or panicky for no good reason. 0  ?Things have been getting on top of me. 0  ?I have been so unhappy that I have had difficulty sleeping. 0  ?I have felt sad or miserable. 0  ?I have been so unhappy that I have been crying. 0  ?The thought of harming myself has occurred to me. 0  ?Edinburgh Postnatal Depression Scale Total 1  ? ? ? ? ?After visit meds:  ?Allergies as of 07/30/2021   ?No  Known Allergies ?  ? ?  ?Medication List  ?  ? ?STOP taking these medications   ? ?metFORMIN 500 MG tablet ?Commonly known as: GLUCOPHAGE ?  ? ?  ? ?TAKE these medications   ? ?acetaminophen 500 MG tablet ?Commonly known as: TYLENOL ?Take 1,000 mg by mouth every 6 (six) hours as needed for moderate pain. ?  ?cetirizine 10 MG tablet ?Commonly known as: ZYRTEC ?Take 10 mg by mouth daily as needed for allergies. ?  ?ibuprofen 100 MG/5ML suspension ?Commonly known as: ADVIL ?Take 30 mLs (600 mg total) by mouth every 6 (six) hours as needed for mild pain. ?  ?prenatal multivitamin Tabs tablet ?Take 1 tablet by mouth daily at 12 noon. ?What changed: when to take this ?  ?senna-docusate 8.6-50 MG tablet ?Commonly known as: Senokot-S ?Take 2 tablets by mouth daily. ?  ? ?  ? ?  ?  ? ? ?  ?Discharge Care Instructions  ?(From admission, onward)  ?  ? ? ?  ? ?  Start      Ordered  ? 07/30/21 0000  Change dressing (specify)       ?Comments: Dressing change: day 7 or PRN  ? 07/30/21 0929  ? ?  ?  ? ?  ? ? ? ?Discharge home in stable condition ?Infant Feeding: Breast ?Infant Disposition:home with mother ?Discharge instruction: per After Visit Summary and Postpartum booklet. ?Activity: Advance as tolerated. Pelvic rest for 6 weeks.  ?Diet: routine diet ?Anticipated Birth Control: Plans Interval BTL ?Postpartum Appointment: [facility] visit ?Additional Postpartum F/U: Incision check 1 week ?Future Appointments:No future appointments. ?Follow up Visit: ? Follow-up Information   ? ? [facility]. Schedule an appointment as soon as possible for a visit in 6 week(s).   ?Specialty: Obstetrics and Gynecology ?Contact information: ?[address] ?[address] ?[place] [address] ?[phone] ? ?  ?  ? ? Crawford Givens, MD. Schedule an appointment as soon as possible for a visit in 6 day(s).   ?Specialty: Obstetrics and Gynecology ?Contact information: ?[address] ?[address] ?[place] [address] ?[phone] ? ? ?  ?  ? ?  ?  ? ?  ? ? ? ?  ? ?07/30/2021 ?Betsy Coder, MD ? ? ?

## 2021-08-08 ENCOUNTER — Telehealth (HOSPITAL_COMMUNITY): Payer: Self-pay

## 2021-08-08 NOTE — Telephone Encounter (Signed)
No answer. Left message to return nurse call. ? Jerald Kief ?08/08/2021,1907 ?

## 2021-11-06 ENCOUNTER — Other Ambulatory Visit: Payer: Self-pay | Admitting: Obstetrics and Gynecology

## 2021-11-20 ENCOUNTER — Other Ambulatory Visit: Payer: Self-pay

## 2021-11-20 ENCOUNTER — Encounter (HOSPITAL_COMMUNITY): Payer: Self-pay | Admitting: Obstetrics and Gynecology

## 2021-11-20 NOTE — Pre-Procedure Instructions (Signed)
[facility], [place] - [address] [facility] [place] [address] Phone: [phone] Fax: [phone]  PCP - Denies Cardiologist - Denies  ERAS Protcol - Clears until 0600  Anesthesia review: N  Patient verbally denies any shortness of breath, fever, cough and chest pain during phone call   -------------  SDW INSTRUCTIONS given:  Your procedure is scheduled on Wednesday, 11/22/21.  Report to [facility] Main Entrance "A" at 0630 A.M., and check in at the Admitting office.  Call this number if you have problems the morning of surgery:  [phone]   Remember:  Do not eat after midnight the night before your surgery  You may drink clear liquids until 0600 the morning of your surgery.   Clear liquids allowed are: Water, Non-Citrus Juices (without pulp), Carbonated Beverages, Clear Tea, Black Coffee Only, and Gatorade    Take these medicines the morning of surgery with A SIP OF WATER  Tylenol-if needed  As of today, STOP taking any Aspirin (unless otherwise instructed by your surgeon) Aleve, Naproxen, Ibuprofen, Motrin, Advil, Goody's, BC's, all herbal medications, fish oil, and all vitamins.                      Do not wear jewelry, make up, or nail polish            Do not wear lotions, powders, perfumes/colognes, or deodorant.            Do not shave 48 hours prior to surgery.  Men may shave face and neck.            Do not bring valuables to the hospital.            [facility] is not responsible for any belongings or valuables.  Do NOT Smoke (Tobacco/Vaping) 24 hours prior to your procedure If you use a CPAP at night, you may bring all equipment for your overnight stay.   Contacts, glasses, dentures or bridgework may not be worn into surgery.      For patients admitted to the hospital, discharge time will be determined by your treatment team.   Patients discharged the day of surgery will not be allowed to drive home, and someone  needs to stay with them for 24 hours.    Special instructions:   [facility]- Preparing For Surgery  Before surgery, you can play an important role. Because skin is not sterile, your skin needs to be as free of germs as possible. You can reduce the number of germs on your skin by washing with CHG (chlorahexidine gluconate) Soap before surgery.  CHG is an antiseptic cleaner which kills germs and bonds with the skin to continue killing germs even after washing.    Oral Hygiene is also important to reduce your risk of infection.  Remember - BRUSH YOUR TEETH THE MORNING OF SURGERY WITH YOUR REGULAR TOOTHPASTE  Please do not use if you have an allergy to CHG or antibacterial soaps. If your skin becomes reddened/irritated stop using the CHG.  Do not shave (including legs and underarms) for at least 48 hours prior to first CHG shower. It is OK to shave your face.  Please follow these instructions carefully.   Shower the NIGHT BEFORE SURGERY and the MORNING OF SURGERY with DIAL Soap.   Pat yourself dry with a CLEAN TOWEL.  Wear CLEAN PAJAMAS to bed the night before surgery  Place CLEAN SHEETS on your bed the night  of your first shower and DO NOT SLEEP WITH PETS.   Day of Surgery: Please shower morning of surgery  Wear Clean/Comfortable clothing the morning of surgery Do not apply any deodorants/lotions.   Remember to brush your teeth WITH YOUR REGULAR TOOTHPASTE.   Questions were answered. Patient verbalized understanding of instructions.

## 2021-11-21 NOTE — H&P (Signed)
Madison Pain Ramirez31 y.o. female. G3P3  who presents for B salpingectomy.  She has had three cesarean sections and desires sterilization.   Pertinent Gynecological History: Contraception: Education given regarding options for contraception. Blood transfusions: NA Sexually transmitted diseases: none Previous GYN Procedures: CS times three Last mammogram:  Last pap: normal Date: 2016 WNL OB History: G3P3   Menstrual History: Menarche age: 31 No LMP recorded.    Past Medical History:  Diagnosis Date   Abnormal Pap smear    Anxiety    Eczema    GERD (gastroesophageal reflux disease)    Gestational diabetes    Headache    Migraines   No pertinent past medical history    Obese    Varicella    at age 31 or 76   Yeast infection of the vagina    frequent   Past Surgical History:  Procedure Laterality Date   CESAREAN SECTION  07/12/2011   Procedure: CESAREAN SECTION;  Surgeon: Hal Morales, MD;  Location: [facility];  Service: Gynecology;  Laterality: N/A;  Primary Cesarean Section Delivery Baby Girl @ [phone], Apgars 9/9   CESAREAN SECTION N/A 03/23/2015   Procedure: CESAREAN SECTION;  Surgeon: Osborn Coho, MD;  Location: [facility];  Service: Obstetrics;  Laterality: N/A;   CESAREAN SECTION N/A 07/28/2021   Procedure: CESAREAN SECTION;  Surgeon: Jaymes Graff, MD;  Location: [facility] LD ORS;  Service: Obstetrics;  Laterality: N/A;   WISDOM TOOTH EXTRACTION     No current facility-administered medications for this encounter.  Current Outpatient Medications:    acetaminophen (TYLENOL) 500 MG tablet, Take 1,000 mg by mouth every 6 (six) hours as needed for moderate pain., Disp: , Rfl:    Menthol, Topical Analgesic, (ICY HOT EX), Apply 1 Application topically daily as needed (pain)., Disp: , Rfl:    ibuprofen (ADVIL) 100 MG/5ML suspension, Take 30 mLs (600 mg total) by mouth every 6 (six) hours as needed for mild pain. (Patient not taking: Reported on 11/15/2021), Disp: 30 mL, Rfl: 0   Prenatal  Vit-Fe Fumarate-FA (PRENATAL MULTIVITAMIN) TABS tablet, Take 1 tablet by mouth daily at 12 noon. (Patient not taking: Reported on 11/15/2021), Disp: 90 tablet, Rfl: 1   senna-docusate (SENOKOT-S) 8.6-50 MG tablet, Take 2 tablets by mouth daily. (Patient not taking: Reported on 11/15/2021), Disp: 60 tablet, Rfl: 1 No Known Allergies Review of Systems - Dermatological ROS:     Physical Exam  Ht 5\' 3"  (1.6 m)   Wt 103.4 kg   BMI 40.39 kg/m  Constitutional: She appears well-developed and well-nourished.  HENT:  Head: Normocephalic.  Eyes: Pupils are equal, round, and reactive to light. H/O PSORIASIS Neck: Normal range of motion. Neck supple.  Cardiovascular: Regular rhythm.   Respiratory: Effort normal and breath sounds normal.  GI: Soft.  Genitourinary: NORMAL V/V normal cervix NT  no masses.  Uterus mobile NT  Musculoskeletal: Normal range of motion.  Neurological: She is alert.  Skin: Skin is warm.  Psychiatric: She has a normal mood and affect.  No results found for this or any previous visit (from the past 72 hour(s)).   Assessment/Plan: Multiparity desires Sterilization.  Risks are but not limited to bleeding, infection, ectopic pregnancy.  May need Ex lap due to adhesions or bleeding.  She unserstans and would like to proce All BC reviewed in detail with the pt Regret of BTL before the age of 31 also discussed    Yamil Dougher A Date of Initial H&P: 11/21/21  UPT neg  History  reviewed, patient examined, no change in status, stable for surgery.

## 2021-11-21 NOTE — Anesthesia Preprocedure Evaluation (Signed)
Anesthesia Evaluation  Patient identified by MRN, date of birth, ID band Patient awake    Reviewed: Allergy & Precautions, NPO status , Patient's Chart, lab work & pertinent test results  Airway Mallampati: II  TM Distance: >3 FB Neck ROM: Full    Dental no notable dental hx. (+) Teeth Intact, Dental Advisory Given   Pulmonary former smoker,    Pulmonary exam normal breath sounds clear to auscultation       Cardiovascular Normal cardiovascular exam Rhythm:Regular Rate:Normal     Neuro/Psych Anxiety negative neurological ROS     GI/Hepatic Neg liver ROS, GERD  ,  Endo/Other  diabetes  Renal/GU      Musculoskeletal   Abdominal (+) + obese (BMI 40.39),   Peds  Hematology   Anesthesia Other Findings   Reproductive/Obstetrics                          Anesthesia Physical Anesthesia Plan  ASA: 3  Anesthesia Plan: General   Post-op Pain Management: Toradol IV (intra-op)*, Precedex and Tylenol PO (pre-op)*   Induction: Intravenous  PONV Risk Score and Plan: 4 or greater and Treatment may vary due to age or medical condition, Midazolam, Ondansetron and Dexamethasone  Airway Management Planned: Oral ETT  Additional Equipment: None  Intra-op Plan:   Post-operative Plan: Extubation in OR  Informed Consent:     Dental advisory given  Plan Discussed with: CRNA  Anesthesia Plan Comments:        Anesthesia Quick Evaluation

## 2021-11-22 ENCOUNTER — Ambulatory Visit (HOSPITAL_BASED_OUTPATIENT_CLINIC_OR_DEPARTMENT_OTHER): Payer: Medicaid Other | Admitting: Anesthesiology

## 2021-11-22 ENCOUNTER — Encounter (HOSPITAL_COMMUNITY)
Admission: RE | Disposition: A | Discharge status: Home / Self Care | Payer: Self-pay | Source: Home / Self Care | Attending: Obstetrics and Gynecology

## 2021-11-22 ENCOUNTER — Encounter (HOSPITAL_COMMUNITY): Payer: Self-pay | Admitting: Obstetrics and Gynecology

## 2021-11-22 ENCOUNTER — Ambulatory Visit (HOSPITAL_COMMUNITY)
Admission: RE | Admit: 2021-11-22 | Discharge: 2021-11-22 | Disposition: A | Discharge status: Home / Self Care | Payer: Medicaid Other | Attending: Obstetrics and Gynecology | Admitting: Obstetrics and Gynecology

## 2021-11-22 ENCOUNTER — Ambulatory Visit (HOSPITAL_COMMUNITY): Payer: Medicaid Other | Admitting: Anesthesiology

## 2021-11-22 DIAGNOSIS — Z6841 Body Mass Index (BMI) 40.0 and over, adult: Secondary | ICD-10-CM | POA: Diagnosis not present

## 2021-11-22 DIAGNOSIS — E669 Obesity, unspecified: Secondary | ICD-10-CM

## 2021-11-22 DIAGNOSIS — Z302 Encounter for sterilization: Secondary | ICD-10-CM

## 2021-11-22 DIAGNOSIS — Z87891 Personal history of nicotine dependence: Secondary | ICD-10-CM | POA: Insufficient documentation

## 2021-11-22 DIAGNOSIS — E119 Type 2 diabetes mellitus without complications: Secondary | ICD-10-CM

## 2021-11-22 HISTORY — DX: Gastro-esophageal reflux disease without esophagitis: K21.9

## 2021-11-22 HISTORY — PX: LAPAROSCOPIC BILATERAL SALPINGECTOMY: SHX5889

## 2021-11-22 LAB — CBC
HCT: 42.4 % (ref 36.0–46.0)
Hemoglobin: 14 g/dL (ref 12.0–15.0)
MCH: 28.6 pg (ref 26.0–34.0)
MCHC: 33 g/dL (ref 30.0–36.0)
MCV: 86.5 fL (ref 80.0–100.0)
Platelets: 288 10*3/uL (ref 150–400)
RBC: 4.9 MIL/uL (ref 3.87–5.11)
RDW: 14.3 % (ref 11.5–15.5)
WBC: 12 10*3/uL — ABNORMAL HIGH (ref 4.0–10.5)
nRBC: 0 % (ref 0.0–0.2)

## 2021-11-22 LAB — POCT PREGNANCY, URINE: Preg Test, Ur: NEGATIVE

## 2021-11-22 SURGERY — SALPINGECTOMY, BILATERAL, LAPAROSCOPIC
Anesthesia: General | Laterality: Bilateral

## 2021-11-22 MED ORDER — KETOROLAC TROMETHAMINE 30 MG/ML IJ SOLN
INTRAMUSCULAR | Status: DC
Start: 2021-11-22 — End: 2021-11-22
  Filled 2021-11-22: qty 1

## 2021-11-22 MED ORDER — MIDAZOLAM HCL 2 MG/2ML IJ SOLN
INTRAMUSCULAR | Status: AC
  Filled 2021-11-22: qty 2

## 2021-11-22 MED ORDER — ONDANSETRON HCL 4 MG/2ML IJ SOLN: 4.0000 mg | Freq: Once | INTRAMUSCULAR | Status: DC | PRN

## 2021-11-22 MED ORDER — SUGAMMADEX SODIUM 200 MG/2ML IV SOLN
INTRAVENOUS | Status: DC | PRN
  Administered 2021-11-22: 200 mg via INTRAVENOUS

## 2021-11-22 MED ORDER — ROCURONIUM BROMIDE 10 MG/ML (PF) SYRINGE
PREFILLED_SYRINGE | INTRAVENOUS | Status: DC | PRN
  Administered 2021-11-22: 70 mg via INTRAVENOUS

## 2021-11-22 MED ORDER — KETOROLAC TROMETHAMINE 30 MG/ML IJ SOLN
30.0000 mg | Freq: Once | INTRAMUSCULAR | Status: AC | PRN
Start: 2021-11-22 — End: 2021-11-22
  Administered 2021-11-22: 30 mg via INTRAVENOUS

## 2021-11-22 MED ORDER — FENTANYL CITRATE (PF) 250 MCG/5ML IJ SOLN
INTRAMUSCULAR | Status: AC
  Filled 2021-11-22: qty 5

## 2021-11-22 MED ORDER — HYDROMORPHONE HCL 1 MG/ML IJ SOLN
INTRAMUSCULAR | Status: AC
  Filled 2021-11-22: qty 1

## 2021-11-22 MED ORDER — OXYCODONE HCL 5 MG PO TABS: 5.0000 mg | ORAL_TABLET | Freq: Once | ORAL | Status: DC | PRN

## 2021-11-22 MED ORDER — MIDAZOLAM HCL 2 MG/2ML IJ SOLN
INTRAMUSCULAR | Status: DC | PRN
  Administered 2021-11-22: 2 mg via INTRAVENOUS

## 2021-11-22 MED ORDER — ONDANSETRON HCL 4 MG/2ML IJ SOLN
INTRAMUSCULAR | Status: DC | PRN
  Administered 2021-11-22: 4 mg via INTRAVENOUS

## 2021-11-22 MED ORDER — LACTATED RINGERS IV SOLN: INTRAVENOUS | Status: DC

## 2021-11-22 MED ORDER — HYDROMORPHONE HCL 1 MG/ML IJ SOLN
0.2500 mg | INTRAMUSCULAR | Status: DC | PRN
  Administered 2021-11-22 (×3): 0.5 mg via INTRAVENOUS

## 2021-11-22 MED ORDER — CHLORHEXIDINE GLUCONATE 0.12 % MT SOLN
15.0000 mL | Freq: Once | OROMUCOSAL | Status: AC
  Administered 2021-11-22: 15 mL via OROMUCOSAL
  Filled 2021-11-22: qty 15

## 2021-11-22 MED ORDER — ORAL CARE MOUTH RINSE: 15.0000 mL | Freq: Once | OROMUCOSAL | Status: AC

## 2021-11-22 MED ORDER — PROPOFOL 10 MG/ML IV BOLUS
INTRAVENOUS | Status: DC | PRN
  Administered 2021-11-22: 170 mg via INTRAVENOUS

## 2021-11-22 MED ORDER — PROPOFOL 10 MG/ML IV BOLUS
INTRAVENOUS | Status: AC
  Filled 2021-11-22: qty 20

## 2021-11-22 MED ORDER — BUPIVACAINE-EPINEPHRINE (PF) 0.25% -1:200000 IJ SOLN
INTRAMUSCULAR | Status: AC
  Filled 2021-11-22: qty 30

## 2021-11-22 MED ORDER — LIDOCAINE 2% (20 MG/ML) 5 ML SYRINGE
INTRAMUSCULAR | Status: DC | PRN
  Administered 2021-11-22: 30 mg via INTRAVENOUS

## 2021-11-22 MED ORDER — ACETAMINOPHEN 500 MG PO TABS
1000.0000 mg | ORAL_TABLET | Freq: Once | ORAL | Status: AC
Start: 2021-11-22 — End: 2021-11-22
  Administered 2021-11-22: 1000 mg via ORAL
  Filled 2021-11-22: qty 2

## 2021-11-22 MED ORDER — FENTANYL CITRATE (PF) 250 MCG/5ML IJ SOLN
INTRAMUSCULAR | Status: DC | PRN
  Administered 2021-11-22: 150 ug via INTRAVENOUS
  Administered 2021-11-22 (×2): 50 ug via INTRAVENOUS

## 2021-11-22 MED ORDER — POVIDONE-IODINE 10 % EX SWAB
2.0000 | Freq: Once | CUTANEOUS | Status: AC
  Administered 2021-11-22: 2 via TOPICAL

## 2021-11-22 MED ORDER — OXYCODONE HCL 5 MG/5ML PO SOLN: 5.0000 mg | Freq: Once | ORAL | Status: DC | PRN

## 2021-11-22 MED ORDER — DEXAMETHASONE SODIUM PHOSPHATE 10 MG/ML IJ SOLN
INTRAMUSCULAR | Status: DC | PRN
  Administered 2021-11-22: 10 mg via INTRAVENOUS

## 2021-11-22 MED ORDER — BUPIVACAINE HCL 0.25 % IJ SOLN
INTRAMUSCULAR | Status: DC | PRN
  Administered 2021-11-22: 12 mL

## 2021-11-22 MED ORDER — OXYCODONE-ACETAMINOPHEN 5-325 MG PO TABS
1.0000 | ORAL_TABLET | Freq: Four times a day (QID) | ORAL | 0 refills | Status: AC | PRN
Start: 2021-11-22 — End: ?

## 2021-11-22 MED ORDER — SODIUM CHLORIDE 0.9 % IR SOLN
Status: DC | PRN
  Administered 2021-11-22: 1000 mL

## 2021-11-22 SURGICAL SUPPLY — 32 items
ADH SKN CLS APL DERMABOND .7 (GAUZE/BANDAGES/DRESSINGS) ×1
BAG SPEC RTRVL LRG 6X4 10 (ENDOMECHANICALS)
DERMABOND ADVANCED (GAUZE/BANDAGES/DRESSINGS) ×1
DERMABOND ADVANCED .7 DNX12 (GAUZE/BANDAGES/DRESSINGS) ×2 IMPLANT
DURAPREP 26ML APPLICATOR (WOUND CARE) ×3 IMPLANT
FORCEPS CUTTING 33CM 5MM (CUTTING FORCEPS) ×1 IMPLANT
GAUZE VASELINE 1X8 (GAUZE/BANDAGES/DRESSINGS) IMPLANT
GLOVE BIO SURGEON STRL SZ 6.5 (GLOVE) ×3 IMPLANT
GLOVE BIOGEL PI IND STRL 7.0 (GLOVE) ×6 IMPLANT
GLOVE BIOGEL PI INDICATOR 7.0 (GLOVE) ×3
GOWN STRL REUS W/ TWL LRG LVL3 (GOWN DISPOSABLE) ×4 IMPLANT
GOWN STRL REUS W/TWL LRG LVL3 (GOWN DISPOSABLE) ×4
IRRIG SUCT STRYKERFLOW 2 WTIP (MISCELLANEOUS) ×2
IRRIGATION SUCT STRKRFLW 2 WTP (MISCELLANEOUS) IMPLANT
KIT TURNOVER KIT B (KITS) ×3 IMPLANT
MANIPULATOR UTERINE 4.5 ZUMI (MISCELLANEOUS) IMPLANT
NS IRRIG 1000ML POUR BTL (IV SOLUTION) ×3 IMPLANT
PACK LAPAROSCOPY BASIN (CUSTOM PROCEDURE TRAY) ×3 IMPLANT
PACK TRENDGUARD 600 HYBRD PROC (MISCELLANEOUS) IMPLANT
POUCH SPECIMEN RETRIEVAL 10MM (ENDOMECHANICALS) IMPLANT
PROTECTOR NERVE ULNAR (MISCELLANEOUS) ×6 IMPLANT
SET TUBE SMOKE EVAC HIGH FLOW (TUBING) ×3 IMPLANT
SLEEVE ENDOPATH XCEL 5M (ENDOMECHANICALS) ×3 IMPLANT
SUT MNCRL AB 3-0 PS2 27 (SUTURE) ×3 IMPLANT
SUT VICRYL 0 ENDOLOOP (SUTURE) IMPLANT
SUT VICRYL 0 UR6 27IN ABS (SUTURE) ×6 IMPLANT
TOWEL GREEN STERILE FF (TOWEL DISPOSABLE) ×6 IMPLANT
TRAY FOLEY W/BAG SLVR 14FR (SET/KITS/TRAYS/PACK) ×3 IMPLANT
TRENDGUARD 600 HYBRID PROC PK (MISCELLANEOUS) ×2
TROCAR BALLN 12MMX100 BLUNT (TROCAR) ×3 IMPLANT
TROCAR XCEL NON-BLD 5MMX100MML (ENDOMECHANICALS) ×3 IMPLANT
WARMER LAPAROSCOPE (MISCELLANEOUS) ×3 IMPLANT

## 2021-11-22 NOTE — Anesthesia Procedure Notes (Signed)
Procedure Name: Intubation Date/Time: 11/22/2021 9:36 AM  Performed by: Eligha Bridegroom, CRNAPre-anesthesia Checklist: Patient identified, Emergency Drugs available, Suction available and Patient being monitored Patient Re-evaluated:Patient Re-evaluated prior to induction Oxygen Delivery Method: Circle System Utilized Preoxygenation: Pre-oxygenation with 100% oxygen Induction Type: IV induction Ventilation: Mask ventilation without difficulty Laryngoscope Size: Mac and 3 Grade View: Grade I Tube type: Oral Number of attempts: 1 Airway Equipment and Method: Stylet and Oral airway Placement Confirmation: ETT inserted through vocal cords under direct vision, positive ETCO2 and breath sounds checked- equal and bilateral Secured at: 21 cm Tube secured with: Tape Dental Injury: Teeth and Oropharynx as per pre-operative assessment

## 2021-11-22 NOTE — Transfer of Care (Signed)
Immediate Anesthesia Transfer of Care Note  Patient: Madison Perry  Procedure(s) Performed: LAPAROSCOPIC BILATERAL SALPINGECTOMY (Bilateral)  Patient Location: PACU  Anesthesia Type:General  Level of Consciousness: awake and alert   Airway & Oxygen Therapy: Patient Spontanous Breathing  Post-op Assessment: Report given to RN and Post -op Vital signs reviewed and stable  Post vital signs: Reviewed and stable  Last Vitals:  Vitals Value Taken Time  BP 142/91 11/22/21 1100  Temp    Pulse 97 11/22/21 1101  Resp 13 11/22/21 1101  SpO2 97 % 11/22/21 1101  Vitals shown include unvalidated device data.  Last Pain:  Vitals:   11/22/21 0704  TempSrc: Oral         Complications: No notable events documented.

## 2021-11-22 NOTE — Anesthesia Postprocedure Evaluation (Signed)
Anesthesia Post Note  Patient: Madison Perry  Procedure(s) Performed: LAPAROSCOPIC BILATERAL SALPINGECTOMY (Bilateral)     Patient location during evaluation: PACU Anesthesia Type: General Level of consciousness: awake and alert Pain management: pain level controlled Vital Signs Assessment: post-procedure vital signs reviewed and stable Respiratory status: spontaneous breathing, nonlabored ventilation, respiratory function stable and patient connected to nasal cannula oxygen Cardiovascular status: blood pressure returned to baseline and stable Postop Assessment: no apparent nausea or vomiting Anesthetic complications: no   No notable events documented.  Last Vitals:  Vitals:   11/22/21 1145 11/22/21 1200  BP: 113/71 117/84  Pulse: 68 72  Resp: 12 16  Temp:  (!) 36.2 C  SpO2: 92% 93%    Last Pain:  Vitals:   11/22/21 1200  TempSrc:   PainSc: 4                  Trevor Iha

## 2021-11-22 NOTE — Op Note (Signed)
dications: Madison Perry is a 31 y.o. female with diagnosis of multiparity.  Pre-operative Diagnosis: Multiparity desires sterilization  Post-operative Diagnosis: same  Surgeon: [id number],JYNWG A   Assistants: RNFA  Anesthesia: General endotracheal anesthesia   Procedure : Laparoscopic Bilateral Salpingectomy  Procedure Details  The patient was seen in the Holding Room. The risks, benefits, complications, treatment options, and expected outcomes were discussed with the patient. The possibilities of reaction to medication, pulmonary aspiration, perforation of viscus, bleeding, recurrent infection, the need for additional procedures, failure to diagnose a condition, and creating a complication requiring transfusion or operation were discussed with the patient. The patient concurred with the proposed plan, giving informed consent. The patient was taken to the Operating Room, identified as Madison Perry and the procedure verified as Diagnostic Laparoscopy with B sallpingecotmy. A Time Out was held and the above information confirmed.  After induction of general anesthesia, the patient was placed in modified dorsal lithotomy position where she was prepped, draped, and catheterized in the normal, sterile fashion. A foley catheter was placed..  The cervix was visualized and an intrauterine manipulator was placed. A 2 cm umbilical incision was then performed.and carried down to the fascia.  The fascia was then opened and extended bilaterally.  Peritoneum was then entered.  o vicryl was then placed around the fascia in a circumferential fashion.   The hasson was placed and ancored to the suture.. Normal pelvic anatomy was noted.   Uterus had a 2 cm fundal fibroid.  The tubes, ovaries and appendix apperared normal.   The anterior and  Posterior culdesac and liver appeared normal.     Two 67mm trocars were placed in the right and left lower quadrants of the abdomen under direct visualization of the  laparoscope.  The tubes were removed and sent to pathology using the gyrus.  Hemostasis was noted.  Air was allowed to leave the abdomen.  The abdomen was reinsufflated and hemostasis was still noted.     Following the procedure the umbilical hasson was removed after intra-abdominal carbon dioxide was expressed. The fascia was reaproximated by tying the 0 vicryl suture.   The 9mm skin incision was closed with dermabond.  The 10 mm incision was closed with a  subcuticular suture of 3-0 monocryl. The intrauterine manipulator was then removed.  The tenaculum site was oozing and made henmostatic with silver nitrate.   Instrument, sponge, and needle counts were correct prior to abdominal closure and at the conclusion of the case.  Findings: See above Estimated Blood Loss:  Minimal         Drains: none         Total IV Fluids:Intravenous fluids were administered, normal saline  m         Specimens: none              Complications:  None; patient tolerated the procedure well.         Disposition: PACU - hemodynamically stable.         Condition: stable

## 2021-11-23 ENCOUNTER — Encounter (HOSPITAL_COMMUNITY): Payer: Self-pay | Admitting: Obstetrics and Gynecology

## 2021-11-23 LAB — SURGICAL PATHOLOGY

## 2021-11-23 NOTE — Addendum Note (Signed)
Addendum  created 11/23/21 1525 by Trevor Iha, MD   Attestation recorded in Intraprocedure, Intraprocedure Attestations deleted, Intraprocedure Attestations filed

## 2021-11-23 NOTE — Addendum Note (Signed)
Addendum  created 11/23/21 1651 by Trevor Iha, MD   Intraprocedure Event edited, Intraprocedure Staff edited

## 2022-03-22 ENCOUNTER — Encounter: Payer: Self-pay | Admitting: Obstetrics and Gynecology
